# Patient Record
Sex: Male | Born: 1995 | Hispanic: No | Marital: Single | Smoking: Never smoker
Health system: Southern US, Community
[De-identification: ages and names within clinical notes are randomized; demographics above are authoritative.]

---
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
## 2017-05-26 ENCOUNTER — Emergency Department (HOSPITAL_COMMUNITY)
Admission: EM | Admit: 2017-05-26 | Discharge: 2017-05-26 | Disposition: A | Discharge status: Home / Self Care | Payer: No Typology Code available for payment source | Attending: Emergency Medicine | Admitting: Emergency Medicine

## 2017-05-26 ENCOUNTER — Encounter (HOSPITAL_COMMUNITY): Payer: Self-pay | Admitting: Emergency Medicine

## 2017-05-26 ENCOUNTER — Emergency Department (HOSPITAL_COMMUNITY): Payer: No Typology Code available for payment source

## 2017-05-26 DIAGNOSIS — N50819 Testicular pain, unspecified: Secondary | ICD-10-CM | POA: Insufficient documentation

## 2017-05-26 LAB — URINALYSIS, ROUTINE W REFLEX MICROSCOPIC
Bilirubin Urine: NEGATIVE
GLUCOSE, UA: NEGATIVE mg/dL
Hgb urine dipstick: NEGATIVE
KETONES UR: 20 mg/dL — AB
LEUKOCYTES UA: NEGATIVE
NITRITE: NEGATIVE
PROTEIN: NEGATIVE mg/dL
Specific Gravity, Urine: 1.021 (ref 1.005–1.030)
pH: 7 (ref 5.0–8.0)

## 2017-05-26 LAB — RAPID HIV SCREEN (HIV 1/2 AB+AG)
HIV 1/2 ANTIBODIES: NONREACTIVE
HIV-1 P24 ANTIGEN - HIV24: NONREACTIVE

## 2017-05-26 MED ORDER — AZITHROMYCIN 1 G PO PACK: 1.0000 g | PACK | Freq: Once | ORAL | Status: DC

## 2017-05-26 MED ORDER — LIDOCAINE HCL (PF) 1 % IJ SOLN
INTRAMUSCULAR | Status: AC
  Administered 2017-05-26: 5 mL
  Filled 2017-05-26: qty 5

## 2017-05-26 MED ORDER — AZITHROMYCIN 250 MG PO TABS
1000.0000 mg | ORAL_TABLET | Freq: Once | ORAL | Status: AC
  Administered 2017-05-26: 1000 mg via ORAL
  Filled 2017-05-26: qty 4

## 2017-05-26 MED ORDER — CEFTRIAXONE SODIUM 250 MG IJ SOLR
250.0000 mg | Freq: Once | INTRAMUSCULAR | Status: AC
  Administered 2017-05-26: 250 mg via INTRAMUSCULAR
  Filled 2017-05-26: qty 250

## 2017-05-26 NOTE — ED Provider Notes (Signed)
[facility] EMERGENCY DEPARTMENT Provider Note   CSN: [id number] Arrival date & time: 05/26/17  1134     History   Chief Complaint Chief Complaint  Patient presents with  . Testicle Pain    HPI Jason Stevens is a 21 y.o. male.  HPI   Mr. Summer a 21 year old male with no significant past medical history who presents the emergency department for evaluation of bilateral testicular pain.  He states that he was diagnosed with chlamydia 2 years ago and has had intermittent episodes of bilateral testicular pain since that time.  Reports that he was seen in [place] for a similar complaint 6 months ago and told that all of his testing came back normal.  Current episode of pain has been going on for the past 5 days.  States that it came on suddenly and has been constant ever since.  Pain is reported to be 8/10 in severity and described as "heavy."  He has not tried any over-the-counter medications for his symptoms.  States that nothing makes it better.  He also endorses mild generalized abdominal pain, states that he feels like he is hungry as he did not eat yesterday or today.  Denies fever, dysuria, frequency, penile discharge, penile pain, testicular swelling, nausea/vomiting.  He denies recent trauma to the testicles.  Reports that he is not currently sexually active, last reported sexual activity was 2 years ago.  He is specifically asking to be tested for STDs.  History reviewed. No pertinent past medical history.  There are no active problems to display for this patient.   History reviewed. No pertinent surgical history.     Home Medications    Prior to Admission medications   Not on File    Family History No family history on file.  Social History Social History   Tobacco Use  . Smoking status: Never Smoker  . Smokeless tobacco: Never Used  Substance Use Topics  . Alcohol use: No    Frequency: Never  . Drug use: No      Allergies   Patient has no allergy information on record.   Review of Systems Review of Systems  Constitutional: Negative for chills, fatigue and fever.  Eyes: Negative for visual disturbance.  Respiratory: Negative for shortness of breath.   Cardiovascular: Negative for chest pain.  Gastrointestinal: Positive for abdominal pain. Negative for diarrhea, nausea and vomiting.  Genitourinary: Positive for testicular pain. Negative for decreased urine volume, difficulty urinating, discharge, dysuria, frequency, genital sores, penile pain, penile swelling and scrotal swelling.  Musculoskeletal: Negative for gait problem.  Skin: Negative for rash.  Neurological: Negative for headaches.  Psychiatric/Behavioral: Negative for agitation.  All other systems reviewed and are negative.    Physical Exam Updated Vital Signs BP 136/82 (BP Location: Left Arm)   Pulse 78   Temp 98.5 F (36.9 C) (Oral)   Resp 16   Ht 5\' 8"  (1.727 m)   Wt 111.1 kg (245 lb)   SpO2 97%   BMI 37.25 kg/m   Physical Exam  Constitutional: He is oriented to person, place, and time. He appears well-developed and well-nourished. No distress.  HENT:  Head: Normocephalic and atraumatic.  Mouth/Throat: Oropharynx is clear and moist. No oropharyngeal exudate.  Eyes: Conjunctivae are normal. Pupils are equal, round, and reactive to light. Right eye exhibits no discharge. Left eye exhibits no discharge.  Neck: Normal range of motion. Neck supple.  Cardiovascular: Normal rate, regular rhythm and intact distal pulses. Exam reveals no  friction rub.  No murmur heard. Pulmonary/Chest: Effort normal and breath sounds normal. No stridor. No respiratory distress. He has no wheezes. He has no rales.  Abdominal: Soft. Bowel sounds are normal. He exhibits no distension. There is no tenderness. There is no guarding.  No CVA tenderness.  Genitourinary:  Genitourinary Comments: Chaperone present for exam. No discharge from  penis. No signs of lesion or erythema on the penis or testicles. The penis and testicles are nontender. No testicular masses or swelling.   Musculoskeletal: Normal range of motion.  Lymphadenopathy:    He has no cervical adenopathy.  Neurological: He is alert and oriented to person, place, and time. Coordination normal.  Skin: Skin is warm and dry. Capillary refill takes less than 2 seconds. He is not diaphoretic.  Psychiatric: He has a normal mood and affect. His behavior is normal.  Nursing note and vitals reviewed.   ED Treatments / Results  Labs (all labs ordered are listed, but only abnormal results are displayed) Labs Reviewed  URINALYSIS, ROUTINE W REFLEX MICROSCOPIC - Abnormal; Notable for the following components:      Result Value   APPearance CLOUDY (*)    Ketones, ur 20 (*)    All other components within normal limits  RAPID HIV SCREEN (HIV 1/2 AB+AG)  RPR  GC/CHLAMYDIA PROBE AMP ([employer]) NOT AT [facility]    EKG  EKG Interpretation None       Radiology US Scrotum  Result Date: 05/26/2017 CLINICAL DATA:  Initial evaluation for bilateral pelvic pain for 5 days. EXAM: ULTRASOUND OF SCROTUM TECHNIQUE: Complete ultrasound examination of the testicles, epididymis, and other scrotal structures was performed. COMPARISON:  None. FINDINGS: Right testicle Measurements: 4.4 x 2.4 x 2.8 cm. No mass or microlithiasis visualized. Left testicle Measurements: 3.8 x 2.3 x 3.2 cm. No mass or microlithiasis visualized. Right epididymis:  Normal in size and appearance. Left epididymis: Normal in size and appearance. 6 x 7 x 6 mm simple anechoic cyst at the left epididymal head, of doubtful significance. Hydrocele:  None visualized. Varicocele:  None visualized. IMPRESSION: Negative. No evidence for testicular mass or other significant abnormality. Electronically Signed   By: Rise Mu M.D.   On: 05/26/2017 16:01   [place] Scrotom Doppler  Result Date: 05/26/2017 CLINICAL DATA:   Initial evaluation for bilateral pelvic pain for 5 days. EXAM: ULTRASOUND OF SCROTUM TECHNIQUE: Complete ultrasound examination of the testicles, epididymis, and other scrotal structures was performed. COMPARISON:  None. FINDINGS: Right testicle Measurements: 4.4 x 2.4 x 2.8 cm. No mass or microlithiasis visualized. Left testicle Measurements: 3.8 x 2.3 x 3.2 cm. No mass or microlithiasis visualized. Right epididymis:  Normal in size and appearance. Left epididymis: Normal in size and appearance. 6 x 7 x 6 mm simple anechoic cyst at the left epididymal head, of doubtful significance. Hydrocele:  None visualized. Varicocele:  None visualized. IMPRESSION: Negative. No evidence for testicular mass or other significant abnormality. Electronically Signed   By: Rise Mu M.D.   On: 05/26/2017 16:01    Procedures Procedures (including critical care time)  Medications Ordered in ED Medications  cefTRIAXone (ROCEPHIN) injection 250 mg (not administered)  azithromycin (ZITHROMAX) tablet 1,000 mg (not administered)     Initial Impression / Assessment and Plan / ED Course  I have reviewed the triage vital signs and the nursing notes.  Pertinent labs & imaging results that were available during my care of the patient were reviewed by me and considered in my medical decision making (  see chart for details).    Patient presents with 5 days of bilateral testicular pain.  He has had this intermittently in the past.  No swelling, erythema or pain to the testicles on exam.  No swelling or mass palpated.  Ultrasound of scrotum and Doppler do not reveal testicular mass, torsion or abnormality.  UA not concerning for infection.  He does have a small amount of ketones in his urine, suspect that this is related to the fact that he has not eaten in the past day and a half.  Have informed patient to have this rechecked.  Rapid HIV screen nonreactive.  I have informed patient that he has a GC/chlamydia and  syphilis testing that is pending and he will get a phone call if they are positive.  Patient is specifically requesting a "shot and pill" to treat an STD just in case.  Given patient's concern, have given him Rocephin and azithromycin in the ER.  Patient's vital signs stable, he is nontoxic-appearing and afebrile.  Discussed return precautions and patient agrees and voiced understanding.  Have discussed this patient who was also seen by Dr. Ethelda ChickJacubowitz who also agrees with discharge.   Final Clinical Impressions(s) / ED Diagnoses   Final diagnoses:  Testicular pain    New Prescriptions This SmartLink is deprecated. Use AVSMEDLIST instead to display the medication list for a patient.   Kellie ShropshireShrosbree, Emily J, PA-C 05/26/17 1649    Doug SouJacubowitz, Sam, MD 05/26/17 2053

## 2017-05-26 NOTE — ED Notes (Signed)
Patient Alert and oriented X4. Stable and ambulatory. Patient verbalized understanding of the discharge instructions.  Patient belongings were taken by the patient.  

## 2017-05-26 NOTE — ED Triage Notes (Signed)
Pt. Stated, Im having testicular pain started on Wednesday. I've had it before.

## 2017-05-26 NOTE — ED Provider Notes (Addendum)
Bilateral testicular pain, posteriorly for 5 days.  Pain is not made better or worse by anything.  Pain is mild at present.  No urinary symptoms.  Presently hungry.  Denies discharge from penis.  No other associated symptoms.  He does report that he had similar symptoms in the past and was treated for chlamydia which improved.  On exam no distress abdomen soft nontender genitalia normal male.  Scrotum is normal.  Testes in normal lie.  No tenderness or swelling.  Penis uncircumcised, normal.  Declines pain medicine   Doug SouJacubowitz, Carry Weesner, MD 05/26/17 1327    Doug SouJacubowitz, Tonnie Friedel, MD 05/26/17 1327

## 2017-05-26 NOTE — ED Notes (Signed)
Pt ambulates to bathroom wihtou difficulty.

## 2017-05-26 NOTE — Discharge Instructions (Signed)
You will get a call in the next 3 days if your chlamydia and gonorrhea testing is positive.  You also have syphilis blood work which is pending.  Your HIV test was negative.  Your urine is not infected.  The ultrasound of your testicles did not show mass, torsion or acute abnormality.  You were treated for chlamydia and gonorrhea in the ER today.  Please return to the emergency department if you experience any new or worsening symptoms.

## 2017-05-26 NOTE — ED Notes (Signed)
Pt c/o testicle pain x 5 days. Denies swelling or drainage . Pain only. Ambulates easilty to room and placedc in gown.

## 2017-05-27 LAB — GC/CHLAMYDIA PROBE AMP (~~LOC~~) NOT AT ARMC
Chlamydia: NEGATIVE
Neisseria Gonorrhea: NEGATIVE

## 2017-05-27 LAB — RPR: RPR Ser Ql: NONREACTIVE

## 2017-07-14 ENCOUNTER — Other Ambulatory Visit: Payer: Self-pay

## 2017-07-14 ENCOUNTER — Emergency Department (HOSPITAL_COMMUNITY)
Admission: EM | Admit: 2017-07-14 | Discharge: 2017-07-14 | Disposition: A | Discharge status: Home / Self Care | Payer: No Typology Code available for payment source | Attending: Emergency Medicine | Admitting: Emergency Medicine

## 2017-07-14 ENCOUNTER — Encounter (HOSPITAL_COMMUNITY): Payer: Self-pay | Admitting: Emergency Medicine

## 2017-07-14 ENCOUNTER — Emergency Department (HOSPITAL_COMMUNITY): Payer: No Typology Code available for payment source

## 2017-07-14 DIAGNOSIS — M7918 Myalgia, other site: Secondary | ICD-10-CM | POA: Insufficient documentation

## 2017-07-14 DIAGNOSIS — R5383 Other fatigue: Secondary | ICD-10-CM | POA: Diagnosis not present

## 2017-07-14 DIAGNOSIS — M6281 Muscle weakness (generalized): Secondary | ICD-10-CM | POA: Diagnosis not present

## 2017-07-14 DIAGNOSIS — E860 Dehydration: Secondary | ICD-10-CM

## 2017-07-14 DIAGNOSIS — R1013 Epigastric pain: Secondary | ICD-10-CM | POA: Diagnosis not present

## 2017-07-14 DIAGNOSIS — Z79899 Other long term (current) drug therapy: Secondary | ICD-10-CM | POA: Insufficient documentation

## 2017-07-14 DIAGNOSIS — R531 Weakness: Secondary | ICD-10-CM

## 2017-07-14 LAB — COMPREHENSIVE METABOLIC PANEL
ALT: 22 U/L (ref 17–63)
ANION GAP: 9 (ref 5–15)
AST: 15 U/L (ref 15–41)
Albumin: 4.4 g/dL (ref 3.5–5.0)
Alkaline Phosphatase: 72 U/L (ref 38–126)
BUN: 12 mg/dL (ref 6–20)
CALCIUM: 9.3 mg/dL (ref 8.9–10.3)
CHLORIDE: 104 mmol/L (ref 101–111)
CO2: 23 mmol/L (ref 22–32)
Creatinine, Ser: 1.06 mg/dL (ref 0.61–1.24)
GFR calc Af Amer: >60 mL/min (ref >60)
GFR calc non Af Amer: >60 mL/min (ref >60)
GLUCOSE: 112 mg/dL — AB (ref 65–99)
POTASSIUM: 3.4 mmol/L — AB (ref 3.5–5.1)
Sodium: 136 mmol/L (ref 135–145)
Total Bilirubin: 1.1 mg/dL (ref 0.3–1.2)
Total Protein: 7.6 g/dL (ref 6.5–8.1)

## 2017-07-14 LAB — CBC
HEMATOCRIT: 45.9 % (ref 39.0–52.0)
HEMOGLOBIN: 16.3 g/dL (ref 13.0–17.0)
MCH: 30 pg (ref 26.0–34.0)
MCHC: 35.5 g/dL (ref 30.0–36.0)
MCV: 84.4 fL (ref 78.0–100.0)
Platelets: 315 10*3/uL (ref 150–400)
RBC: 5.44 MIL/uL (ref 4.22–5.81)
RDW: 12.9 % (ref 11.5–15.5)
WBC: 9.7 10*3/uL (ref 4.0–10.5)

## 2017-07-14 LAB — URINALYSIS, ROUTINE W REFLEX MICROSCOPIC
BACTERIA UA: NONE SEEN
Glucose, UA: NEGATIVE mg/dL
Hgb urine dipstick: NEGATIVE
Ketones, ur: NEGATIVE mg/dL
Leukocytes, UA: NEGATIVE
Nitrite: NEGATIVE
PROTEIN: 30 mg/dL — AB
Specific Gravity, Urine: 1.032 — ABNORMAL HIGH (ref 1.005–1.030)
Squamous Epithelial / LPF: NONE SEEN
pH: 6 (ref 5.0–8.0)

## 2017-07-14 LAB — RAPID HIV SCREEN (HIV 1/2 AB+AG)
HIV 1/2 ANTIBODIES: NONREACTIVE
HIV-1 P24 Antigen - HIV24: NONREACTIVE

## 2017-07-14 LAB — MONONUCLEOSIS SCREEN: MONO SCREEN: NEGATIVE

## 2017-07-14 LAB — LIPASE, BLOOD: LIPASE: 25 U/L (ref 11–51)

## 2017-07-14 MED ORDER — GI COCKTAIL ~~LOC~~
30.0000 mL | Freq: Once | ORAL | Status: AC
  Administered 2017-07-14: 30 mL via ORAL
  Filled 2017-07-14: qty 30

## 2017-07-14 MED ORDER — POTASSIUM CHLORIDE CRYS ER 20 MEQ PO TBCR
40.0000 meq | EXTENDED_RELEASE_TABLET | Freq: Once | ORAL | Status: AC
  Administered 2017-07-14: 40 meq via ORAL
  Filled 2017-07-14: qty 2

## 2017-07-14 MED ORDER — OMEPRAZOLE 20 MG PO CPDR: 20.0000 mg | DELAYED_RELEASE_CAPSULE | Freq: Every day | ORAL | 0 refills | Status: AC

## 2017-07-14 MED ORDER — ONDANSETRON 4 MG PO TBDP: 4.0000 mg | ORAL_TABLET | Freq: Three times a day (TID) | ORAL | 0 refills | Status: AC | PRN

## 2017-07-14 NOTE — ED Notes (Signed)
Patient transported to X-ray 

## 2017-07-14 NOTE — ED Triage Notes (Signed)
Pt to ED from home c/o generalized weakness, chills x 2 weeks. Also endorses generalized abd pain and N/V. Denies diarrhea, denies cough. Pt adds that he's been having bilateral testicular pain which he has been seen for and told nothing was wrong, but it continues.

## 2017-07-14 NOTE — ED Provider Notes (Signed)
[facility] Provider Note   CSN: [id number] Arrival date & time: 07/14/17  1445     History   Chief Complaint Chief Complaint  Patient presents with  . Weakness    generalized    HPI Jason Stevens is a 21 y.o. male.  HPI   21 year old male with no significant past medical history here with generalized weakness.  The patient states that over the last month, he has had generalized fatigue.  He states his symptoms started with an illness in which he felt general body aches, chills, and sore throat approximately 1 month ago.  He was seen in the ED and had some testicular pain which was worked up and unremarkable.  Since then, he has had intermittent body aches, chills, and fatigue.  He states that he just feels "drained."  He has not had much of an appetite.  Of note, he is also been smoking increased marijuana over this time.  Denies any depressive thoughts, but does have some anhedonia.  No SI or HI.  No auditory or visual hallucinations.  He has some intermittent associated indigestion and sensation of burning in his abdomen, this began after his illness and has persisted.  It seems to worsen with eating so he has not been eating and drinking as much.  Denies any recent medication change.  Denies any other drug use.  History reviewed. No pertinent past medical history.  There are no active problems to display for this patient.   History reviewed. No pertinent surgical history.     Home Medications    Prior to Admission medications   Medication Sig Start Date End Date Taking? Authorizing Provider  DM-Doxylamine-Acetaminophen (NYQUIL COLD & FLU PO) Take 30 mLs by mouth at bedtime as needed (sleep).   Yes [provider]  Pseudoephedrine-APAP-DM (DAYQUIL MULTI-SYMPTOM COLD/FLU PO) Take 30 mLs by mouth daily as needed (pain).   Yes [provider]  omeprazole (PRILOSEC) 20 MG capsule Take 1 capsule (20 mg total) by  mouth daily for 14 days. 07/14/17 07/28/17  Shaune PollackIsaacs, Levia Waltermire, MD  ondansetron (ZOFRAN ODT) 4 MG disintegrating tablet Take 1 tablet (4 mg total) by mouth every 8 (eight) hours as needed for nausea or vomiting. 07/14/17   Shaune PollackIsaacs, Donnamaria Shands, MD    Family History No family history on file.  Social History Social History   Tobacco Use  . Smoking status: Never Smoker  . Smokeless tobacco: Never Used  Substance Use Topics  . Alcohol use: No    Frequency: Never  . Drug use: Yes    Types: Marijuana    Comment: every now and then     Allergies   Patient has no known allergies.   Review of Systems Review of Systems  Constitutional: Positive for fatigue.  Gastrointestinal: Positive for abdominal pain and nausea.  Neurological: Positive for weakness.  All other systems reviewed and are negative.    Physical Exam Updated Vital Signs BP 133/88   Pulse 93   Temp 97.7 F (36.5 C) (Oral)   Resp 16   Ht 5\' 10"  (1.778 m)   SpO2 95%   BMI 35.15 kg/m   Physical Exam  Constitutional: He is oriented to person, place, and time. He appears well-developed and well-nourished. No distress.  HENT:  Head: Normocephalic and atraumatic.  Mouth/Throat: Oropharynx is clear and moist.  Eyes: Conjunctivae are normal.  Neck: Neck supple.  Mild asymmetric soft, nontender posterior cervical lymphadenopathy  Cardiovascular: Normal rate, regular rhythm and  normal heart sounds. Exam reveals no friction rub.  No murmur heard. Pulmonary/Chest: Effort normal and breath sounds normal. No respiratory distress. He has no wheezes. He has no rales.  Abdominal: Soft. Bowel sounds are normal. He exhibits no distension. There is tenderness (Mild, epigastric). There is no rebound and no guarding.  Musculoskeletal: He exhibits no edema.  Neurological: He is alert and oriented to person, place, and time. He exhibits normal muscle tone.  Skin: Skin is warm. Capillary refill takes less than 2 seconds.  Psychiatric:  He has a normal mood and affect.  Nursing note and vitals reviewed.    ED Treatments / Results  Labs (all labs ordered are listed, but only abnormal results are displayed) Labs Reviewed  COMPREHENSIVE METABOLIC PANEL - Abnormal; Notable for the following components:      Result Value   Potassium 3.4 (*)    Glucose, Bld 112 (*)    All other components within normal limits  URINALYSIS, ROUTINE W REFLEX MICROSCOPIC - Abnormal; Notable for the following components:   Specific Gravity, Urine 1.032 (*)    Bilirubin Urine MODERATE (*)    Protein, ur 30 (*)    All other components within normal limits  LIPASE, BLOOD  CBC  MONONUCLEOSIS SCREEN  RAPID HIV SCREEN (HIV 1/2 AB+AG)    EKG  EKG Interpretation None       Radiology Dg Chest 2 View  Result Date: 07/14/2017 CLINICAL DATA:  Chest pain and shortness of breath. Fatigue and abdominal pain. Weakness for 2 weeks. EXAM: CHEST  2 VIEW COMPARISON:  None. FINDINGS: The heart size and mediastinal contours are within normal limits. Both lungs are clear. The visualized skeletal structures are unremarkable. IMPRESSION: No active cardiopulmonary disease. Electronically Signed   By: Gaylyn RongWalter  Liebkemann M.D.   On: 07/14/2017 17:11    Procedures Procedures (including critical care time)  Medications Ordered in ED Medications  potassium chloride SA (K-DUR,KLOR-CON) CR tablet 40 mEq (40 mEq Oral Given 07/14/17 1634)  gi cocktail (Maalox,Lidocaine,Donnatal) (30 mLs Oral Given 07/14/17 1634)     Initial Impression / Assessment and Plan / ED Course  I have reviewed the triage vital signs and the nursing notes.  Pertinent labs & imaging results that were available during my care of the patient were reviewed by me and considered in my medical decision making (see chart for details).     21 year old male here with generalized weakness.  Patient has multiple complaints which I suspect are likely multifactorial.  He does report intermittent  indigestion and burning epigastric pain which he feels is contributing to his symptoms.  He has mild epigastric tenderness but no signs of rebound or guarding.  His lab work is unremarkable.  Symptoms resolved with GI cocktail.  No other abdominal pain and do not feel imaging is indicated.  Will treat him for indigestion.  Otherwise, all of his symptoms seem to start with a viral type illness in which she had sore throat.  He has some mild posterior cervical lymphadenopathy.  White count is normal and I do not suspect lymphoma or leukemia at this time, but patient very well may have had a mononucleosis with subsequent post viral fatigue.  Discussed this with the patient in detail.  He is in understanding.  Rapid HIV negative.  He has no other apparent acute emergent pathology.  He just underwent a workup for his reported testicular pain a month ago, and has not had any recurrence or worsening of this pain and I  do not suspect he has any kind of ongoing STD.  Moreover, the patient is also smoking marijuana regularly and this could be contributing to his decreased energy and mood.  Regardless, I do not feel there is an acute emergent pathology for his symptoms.  His vital signs are unremarkable and lab work is unremarkable.  Her abdomen remains soft.  He feels markedly improved after GI cocktail and is requesting to go home.  I feel this is reasonable.  I will have him follow-up with the PCP for his multiple complaints, encouraged smoking cessation, and discharged home.  Final Clinical Impressions(s) / ED Diagnoses   Final diagnoses:  Generalized weakness  Dehydration    ED Discharge Orders        Ordered    omeprazole (PRILOSEC) 20 MG capsule  Daily     07/14/17 1829    ondansetron (ZOFRAN ODT) 4 MG disintegrating tablet  Every 8 hours PRN     07/14/17 Thurston Pounds, MD 07/14/17 [id number]

## 2017-07-14 NOTE — ED Notes (Signed)
Declined W/C at D/C and was escorted to lobby by RN. 

## 2017-07-14 NOTE — ED Notes (Signed)
Back from xray

## 2017-07-14 NOTE — Discharge Instructions (Signed)
Your symptoms are likely due to your recent viral illness, possibly mono, with some ongoing indigestion.  For your indigestion, take the medications as prescribed.  Because you may have had mono, you should not engage in ANY contact sports for at least the next 1 month, or until cleared by your doctor. Your spleen may be enlarged and could rupture if you are struck in the abdomen. Avoid any high risk activities in which you could be hit.  For your weakness, your labs did show you were dehydrated. Drink at least 6-8 glasses of water daily. Try to eat a well-balanced diet.  Smoking significantly impairs your body's ability to recover from illness. Try to decrease the amount you are smoking, or stop.

## 2017-07-23 ENCOUNTER — Other Ambulatory Visit: Payer: Self-pay

## 2017-07-23 ENCOUNTER — Emergency Department (HOSPITAL_COMMUNITY)
Admission: EM | Admit: 2017-07-23 | Discharge: 2017-07-23 | Discharge status: Against Medical Advice | Payer: No Typology Code available for payment source | Attending: Emergency Medicine | Admitting: Emergency Medicine

## 2017-07-23 DIAGNOSIS — N50812 Left testicular pain: Secondary | ICD-10-CM | POA: Diagnosis not present

## 2017-07-23 DIAGNOSIS — Z5321 Procedure and treatment not carried out due to patient leaving prior to being seen by health care provider: Secondary | ICD-10-CM | POA: Diagnosis not present

## 2017-07-23 NOTE — ED Triage Notes (Signed)
Pt reports left testicle pain x 4 weeks.  Pt denies any swelling.  Pt denies any urination problems.  Movement makes the pain worse.  Pt reports pain is an 8 out of 10 in triage.  Pt states he's been seen for this problem at [place] when the problem first began. Pt a/o x 4 and ambulatory.

## 2017-07-23 NOTE — ED Notes (Addendum)
Pt called from the lobby with no response x2 

## 2017-07-23 NOTE — ED Notes (Signed)
Pt called for room no answer

## 2017-12-02 ENCOUNTER — Emergency Department (HOSPITAL_COMMUNITY): Payer: Self-pay

## 2017-12-02 ENCOUNTER — Other Ambulatory Visit: Payer: Self-pay

## 2017-12-02 ENCOUNTER — Emergency Department (HOSPITAL_COMMUNITY)
Admission: EM | Admit: 2017-12-02 | Discharge: 2017-12-02 | Disposition: A | Discharge status: Home / Self Care | Payer: Self-pay | Attending: Emergency Medicine | Admitting: Emergency Medicine

## 2017-12-02 ENCOUNTER — Encounter (HOSPITAL_COMMUNITY): Payer: Self-pay

## 2017-12-02 DIAGNOSIS — Z79899 Other long term (current) drug therapy: Secondary | ICD-10-CM | POA: Insufficient documentation

## 2017-12-02 DIAGNOSIS — R109 Unspecified abdominal pain: Secondary | ICD-10-CM

## 2017-12-02 DIAGNOSIS — R1033 Periumbilical pain: Secondary | ICD-10-CM | POA: Insufficient documentation

## 2017-12-02 LAB — URINALYSIS, ROUTINE W REFLEX MICROSCOPIC
BILIRUBIN URINE: NEGATIVE
GLUCOSE, UA: NEGATIVE mg/dL
HGB URINE DIPSTICK: NEGATIVE
Ketones, ur: NEGATIVE mg/dL
Leukocytes, UA: NEGATIVE
Nitrite: NEGATIVE
PROTEIN: NEGATIVE mg/dL
SPECIFIC GRAVITY, URINE: 1.02 (ref 1.005–1.030)
pH: 7 (ref 5.0–8.0)

## 2017-12-02 LAB — CBC
HCT: 48.4 % (ref 39.0–52.0)
Hemoglobin: 16.7 g/dL (ref 13.0–17.0)
MCH: 29.7 pg (ref 26.0–34.0)
MCHC: 34.5 g/dL (ref 30.0–36.0)
MCV: 86 fL (ref 78.0–100.0)
PLATELETS: 320 10*3/uL (ref 150–400)
RBC: 5.63 MIL/uL (ref 4.22–5.81)
RDW: 13.7 % (ref 11.5–15.5)
WBC: 7.6 10*3/uL (ref 4.0–10.5)

## 2017-12-02 LAB — COMPREHENSIVE METABOLIC PANEL
ALBUMIN: 4.4 g/dL (ref 3.5–5.0)
ALT: 27 U/L (ref 17–63)
AST: 18 U/L (ref 15–41)
Alkaline Phosphatase: 70 U/L (ref 38–126)
Anion gap: 6 (ref 5–15)
BUN: 11 mg/dL (ref 6–20)
CHLORIDE: 106 mmol/L (ref 101–111)
CO2: 27 mmol/L (ref 22–32)
CREATININE: 1.02 mg/dL (ref 0.61–1.24)
Calcium: 9.4 mg/dL (ref 8.9–10.3)
GFR calc non Af Amer: >60 mL/min (ref >60)
Glucose, Bld: 99 mg/dL (ref 65–99)
Potassium: 4.3 mmol/L (ref 3.5–5.1)
SODIUM: 139 mmol/L (ref 135–145)
Total Bilirubin: 0.5 mg/dL (ref 0.3–1.2)
Total Protein: 7.5 g/dL (ref 6.5–8.1)

## 2017-12-02 LAB — LIPASE, BLOOD: LIPASE: 26 U/L (ref 11–51)

## 2017-12-02 MED ORDER — ONDANSETRON HCL 4 MG PO TABS: 4.0000 mg | ORAL_TABLET | Freq: Four times a day (QID) | ORAL | 0 refills | Status: AC

## 2017-12-02 MED ORDER — IOHEXOL 300 MG/ML  SOLN
100.0000 mL | Freq: Once | INTRAMUSCULAR | Status: AC | PRN
  Administered 2017-12-02: 100 mL via INTRAVENOUS

## 2017-12-02 MED ORDER — ONDANSETRON 4 MG PO TBDP
4.0000 mg | ORAL_TABLET | Freq: Once | ORAL | Status: AC
  Administered 2017-12-02: 4 mg via ORAL
  Filled 2017-12-02: qty 1

## 2017-12-02 NOTE — Discharge Instructions (Addendum)
Please read attached information. If you experience any new or worsening signs or symptoms please return to the emergency room for evaluation. Please follow-up with your primary care provider or specialist as discussed. Please use medication prescribed only as directed and discontinue taking if you have any concerning signs or symptoms.   °

## 2017-12-02 NOTE — ED Triage Notes (Signed)
Pt states he woke up this morning with vomiting and some generalized abdominal pain. Pt states emesis X2, denies diarrhea.

## 2017-12-02 NOTE — ED Provider Notes (Signed)
[facility] EMERGENCY DEPARTMENT Provider Note   CSN: [id number] Arrival date & time: 12/02/17  0907     History   Chief Complaint Chief Complaint  Patient presents with  . Emesis    HPI Jason Stevens is a 22 y.o. male.  HPI   22 year old male presents with complaints of abdominal pain.  Patient notes Friday developing periumbilical abdominal pain.  He notes that over the last several days it has worsened.  He denies any radiation of pain, he reports vomiting today noting he was unable to eat breakfast, was able to tolerate liquids.  He denies any changes in the color or clarity or characteristics of his bowel movement.  Patient notes he has had symptoms like this previously.   History reviewed. No pertinent past medical history.  There are no active problems to display for this patient.   History reviewed. No pertinent surgical history.      Home Medications    Prior to Admission medications   Medication Sig Start Date End Date Taking? Authorizing Provider  DM-Doxylamine-Acetaminophen (NYQUIL COLD & FLU PO) Take 30 mLs by mouth at bedtime as needed (sleep).    [provider]  omeprazole (PRILOSEC) 20 MG capsule Take 1 capsule (20 mg total) by mouth daily for 14 days. 07/14/17 07/28/17  Shaune Pollack, MD  ondansetron (ZOFRAN ODT) 4 MG disintegrating tablet Take 1 tablet (4 mg total) by mouth every 8 (eight) hours as needed for nausea or vomiting. 07/14/17   Shaune Pollack, MD  ondansetron (ZOFRAN) 4 MG tablet Take 1 tablet (4 mg total) by mouth every 6 (six) hours. 12/02/17   Kamie Korber, Tinnie Gens, PA-C  Pseudoephedrine-APAP-DM (DAYQUIL MULTI-SYMPTOM COLD/FLU PO) Take 30 mLs by mouth daily as needed (pain).    [provider]    Family History History reviewed. No pertinent family history.  Social History Social History   Tobacco Use  . Smoking status: Never Smoker  . Smokeless tobacco: Never Used  Substance Use Topics  .  Alcohol use: No    Frequency: Never  . Drug use: Yes    Types: Marijuana    Comment: every now and then     Allergies   Patient has no known allergies.   Review of Systems Review of Systems  All other systems reviewed and are negative.  Physical Exam Updated Vital Signs BP 139/79 (BP Location: Left Arm)   Pulse (!) 53   Temp 98.6 F (37 C) (Oral)   Resp 16   SpO2 99%   Physical Exam  Constitutional: He is oriented to person, place, and time. He appears well-developed and well-nourished.  HENT:  Head: Normocephalic and atraumatic.  Eyes: Pupils are equal, round, and reactive to light. Conjunctivae are normal. Right eye exhibits no discharge. Left eye exhibits no discharge. No scleral icterus.  Neck: Normal range of motion. No JVD present. No tracheal deviation present.  Pulmonary/Chest: Effort normal. No stridor.  Abdominal:  Tenderness palpation of the umbilical region, no masses rebound or guarding, remainder of abdominal exam nontender  Neurological: He is alert and oriented to person, place, and time. Coordination normal.  Psychiatric: He has a normal mood and affect. His behavior is normal. Judgment and thought content normal.  Nursing note and vitals reviewed.    ED Treatments / Results  Labs (all labs ordered are listed, but only abnormal results are displayed) Labs Reviewed  LIPASE, BLOOD  COMPREHENSIVE METABOLIC PANEL  CBC  URINALYSIS, ROUTINE W REFLEX MICROSCOPIC  EKG None  Radiology Ct Abdomen Pelvis W Contrast  Result Date: 12/02/2017 CLINICAL DATA:  Umbilical region cramping for 3 days.  Diarrhea. EXAM: CT ABDOMEN AND PELVIS WITH CONTRAST TECHNIQUE: Multidetector CT imaging of the abdomen and pelvis was performed using the standard protocol following bolus administration of intravenous contrast. CONTRAST:  OMNIPAQUE IOHEXOL 300 MG/ML  SOLN COMPARISON:  None. FINDINGS: Lower chest: Lung bases are clear. Hepatobiliary: No focal liver lesions  are appreciable. Gallbladder wall is not appreciably thickened. There is no biliary duct dilatation. Pancreas: No pancreatic mass or inflammatory focus. Spleen: No splenic lesions are evident. Adrenals/Urinary Tract: Adrenals bilaterally appear normal. Kidneys bilaterally show no evident mass or hydronephrosis on either side. There is no appreciable renal or ureteral calculus on either side. Urinary bladder is midline with wall thickness within normal limits. Stomach/Bowel: There is no appreciable bowel wall or mesenteric thickening. No evident bowel obstruction. No free air or portal venous air. Vascular/Lymphatic: No abdominal aortic aneurysm. No vascular lesions are evident. No adenopathy is appreciable in the abdomen or pelvis. Reproductive: Prostate and seminal vesicles are normal in size and contour. No evident pelvic mass. Other: Appendix appears normal. No ascites or abscess is evident in the abdomen or pelvis. Musculoskeletal: There are no blastic or lytic bone lesions. There is no intramuscular or abdominal wall lesion. IMPRESSION: 1. A cause for patient's symptoms has not been established with this study. 2. No bowel wall thickening or bowel obstruction. No abscess. Appendix appears normal. 3.  No renal or ureteral calculus.  No hydronephrosis. Electronically Signed   By: Bretta Bang III M.D.   On: 12/02/2017 15:51    Procedures Procedures (including critical care time)  Medications Ordered in ED Medications  ondansetron (ZOFRAN-ODT) disintegrating tablet 4 mg (4 mg Oral Given 12/02/17 1221)  iohexol (OMNIPAQUE) 300 MG/ML solution 100 mL (100 mLs Intravenous Contrast Given 12/02/17 1529)     Initial Impression / Assessment and Plan / ED Course  I have reviewed the triage vital signs and the nursing notes.  Pertinent labs & imaging results that were available during my care of the patient were reviewed by me and considered in my medical decision making (see chart for details).       Final Clinical Impressions(s) / ED Diagnoses   Final diagnoses:  Abdominal pain, unspecified abdominal location   Labs: Urinalysis, lipase, CMP  Imaging: CT abdomen pelvis with contrast  Consults:  Therapeutics: Zofran  Discharge Meds: Zofran  Assessment/Plan: 22 year old male presents today with nausea vomiting abdominal pain.  Uncertain etiology at this time, question gastroenteritis, no signs of acute findings on laboratory analysis CT scan or vital signs.  Patient is tolerating p.o., stable for outpatient follow-up.  Patient is given strict return precautions, he verbalized understanding and agreement to today's plan had no further questions or concerns    ED Discharge Orders        Ordered    ondansetron (ZOFRAN) 4 MG tablet  Every 6 hours     12/02/17 1610       Eyvonne Mechanic, PA-C 12/02/17 1611    Wynetta Fines, MD 12/04/17 [phone]

## 2019-04-02 ENCOUNTER — Emergency Department (HOSPITAL_COMMUNITY)
Admission: EM | Admit: 2019-04-02 | Discharge: 2019-04-03 | Disposition: A | Discharge status: Home / Self Care | Payer: No Typology Code available for payment source | Attending: Emergency Medicine | Admitting: Emergency Medicine

## 2019-04-02 ENCOUNTER — Other Ambulatory Visit: Payer: Self-pay

## 2019-04-02 DIAGNOSIS — R1084 Generalized abdominal pain: Secondary | ICD-10-CM | POA: Insufficient documentation

## 2019-04-02 DIAGNOSIS — Z5321 Procedure and treatment not carried out due to patient leaving prior to being seen by health care provider: Secondary | ICD-10-CM | POA: Insufficient documentation

## 2019-04-02 LAB — COMPREHENSIVE METABOLIC PANEL
ALT: 31 U/L (ref 0–44)
AST: 17 U/L (ref 15–41)
Albumin: 4.4 g/dL (ref 3.5–5.0)
Alkaline Phosphatase: 72 U/L (ref 38–126)
Anion gap: 11 (ref 5–15)
BUN: 7 mg/dL (ref 6–20)
CO2: 22 mmol/L (ref 22–32)
Calcium: 9.3 mg/dL (ref 8.9–10.3)
Chloride: 103 mmol/L (ref 98–111)
Creatinine, Ser: 1.02 mg/dL (ref 0.61–1.24)
GFR calc Af Amer: >60 mL/min (ref >60)
GFR calc non Af Amer: >60 mL/min (ref >60)
Glucose, Bld: 87 mg/dL (ref 70–99)
Potassium: 3.6 mmol/L (ref 3.5–5.1)
Sodium: 136 mmol/L (ref 135–145)
Total Bilirubin: 1 mg/dL (ref 0.3–1.2)
Total Protein: 7.6 g/dL (ref 6.5–8.1)

## 2019-04-02 LAB — CBC
HCT: 51.2 % (ref 39.0–52.0)
Hemoglobin: 17.2 g/dL — ABNORMAL HIGH (ref 13.0–17.0)
MCH: 28.7 pg (ref 26.0–34.0)
MCHC: 33.6 g/dL (ref 30.0–36.0)
MCV: 85.5 fL (ref 80.0–100.0)
Platelets: 333 10*3/uL (ref 150–400)
RBC: 5.99 MIL/uL — ABNORMAL HIGH (ref 4.22–5.81)
RDW: 12.6 % (ref 11.5–15.5)
WBC: 9.4 10*3/uL (ref 4.0–10.5)
nRBC: 0 % (ref 0.0–0.2)

## 2019-04-02 LAB — URINALYSIS, ROUTINE W REFLEX MICROSCOPIC
Bilirubin Urine: NEGATIVE
Glucose, UA: NEGATIVE mg/dL
Hgb urine dipstick: NEGATIVE
Ketones, ur: NEGATIVE mg/dL
Leukocytes,Ua: NEGATIVE
Nitrite: NEGATIVE
Protein, ur: NEGATIVE mg/dL
Specific Gravity, Urine: 1.008 (ref 1.005–1.030)
pH: 5 (ref 5.0–8.0)

## 2019-04-02 LAB — LIPASE, BLOOD: Lipase: 24 U/L (ref 11–51)

## 2019-04-02 MED ORDER — SODIUM CHLORIDE 0.9% FLUSH: 3.0000 mL | Freq: Once | INTRAVENOUS | Status: DC

## 2019-04-02 NOTE — ED Triage Notes (Signed)
Patient reports generalized abdominal pain x 1 week, sometimes worse with walking. Denies N/V/D, urinary symptoms, fevers/chills.

## 2019-04-04 ENCOUNTER — Emergency Department (HOSPITAL_BASED_OUTPATIENT_CLINIC_OR_DEPARTMENT_OTHER): Payer: Self-pay

## 2019-04-04 ENCOUNTER — Other Ambulatory Visit: Payer: Self-pay

## 2019-04-04 ENCOUNTER — Emergency Department (HOSPITAL_BASED_OUTPATIENT_CLINIC_OR_DEPARTMENT_OTHER)
Admission: EM | Admit: 2019-04-04 | Discharge: 2019-04-04 | Disposition: A | Discharge status: Home / Self Care | Payer: Self-pay | Attending: Medical | Admitting: Medical

## 2019-04-04 ENCOUNTER — Encounter (HOSPITAL_BASED_OUTPATIENT_CLINIC_OR_DEPARTMENT_OTHER): Payer: Self-pay | Admitting: Emergency Medicine

## 2019-04-04 DIAGNOSIS — M546 Pain in thoracic spine: Secondary | ICD-10-CM | POA: Insufficient documentation

## 2019-04-04 DIAGNOSIS — R1031 Right lower quadrant pain: Secondary | ICD-10-CM | POA: Insufficient documentation

## 2019-04-04 LAB — URINALYSIS, ROUTINE W REFLEX MICROSCOPIC
Bilirubin Urine: NEGATIVE
Glucose, UA: NEGATIVE mg/dL
Hgb urine dipstick: NEGATIVE
Ketones, ur: 40 mg/dL — AB
Leukocytes,Ua: NEGATIVE
Nitrite: NEGATIVE
Protein, ur: NEGATIVE mg/dL
Specific Gravity, Urine: 1.02 (ref 1.005–1.030)
pH: 6 (ref 5.0–8.0)

## 2019-04-04 LAB — CBC WITH DIFFERENTIAL/PLATELET
Abs Immature Granulocytes: 0.02 10*3/uL (ref 0.00–0.07)
Basophils Absolute: 0.1 10*3/uL (ref 0.0–0.1)
Basophils Relative: 1 %
Eosinophils Absolute: 0.1 10*3/uL (ref 0.0–0.5)
Eosinophils Relative: 1 %
HCT: 52.8 % — ABNORMAL HIGH (ref 39.0–52.0)
Hemoglobin: 17.6 g/dL — ABNORMAL HIGH (ref 13.0–17.0)
Immature Granulocytes: 0 %
Lymphocytes Relative: 24 %
Lymphs Abs: 2.5 10*3/uL (ref 0.7–4.0)
MCH: 28 pg (ref 26.0–34.0)
MCHC: 33.3 g/dL (ref 30.0–36.0)
MCV: 84.1 fL (ref 80.0–100.0)
Monocytes Absolute: 0.7 10*3/uL (ref 0.1–1.0)
Monocytes Relative: 7 %
Neutro Abs: 7.1 10*3/uL (ref 1.7–7.7)
Neutrophils Relative %: 67 %
Platelets: 344 10*3/uL (ref 150–400)
RBC: 6.28 MIL/uL — ABNORMAL HIGH (ref 4.22–5.81)
RDW: 12.5 % (ref 11.5–15.5)
WBC: 10.5 10*3/uL (ref 4.0–10.5)
nRBC: 0 % (ref 0.0–0.2)

## 2019-04-04 LAB — COMPREHENSIVE METABOLIC PANEL
ALT: 28 U/L (ref 0–44)
AST: 16 U/L (ref 15–41)
Albumin: 4.9 g/dL (ref 3.5–5.0)
Alkaline Phosphatase: 81 U/L (ref 38–126)
Anion gap: 14 (ref 5–15)
BUN: 14 mg/dL (ref 6–20)
CO2: 23 mmol/L (ref 22–32)
Calcium: 9.3 mg/dL (ref 8.9–10.3)
Chloride: 99 mmol/L (ref 98–111)
Creatinine, Ser: 0.9 mg/dL (ref 0.61–1.24)
GFR calc Af Amer: >60 mL/min (ref >60)
GFR calc non Af Amer: >60 mL/min (ref >60)
Glucose, Bld: 89 mg/dL (ref 70–99)
Potassium: 3.6 mmol/L (ref 3.5–5.1)
Sodium: 136 mmol/L (ref 135–145)
Total Bilirubin: 1 mg/dL (ref 0.3–1.2)
Total Protein: 8.3 g/dL — ABNORMAL HIGH (ref 6.5–8.1)

## 2019-04-04 LAB — LIPASE, BLOOD: Lipase: 27 U/L (ref 11–51)

## 2019-04-04 MED ORDER — IOHEXOL 300 MG/ML  SOLN
100.0000 mL | Freq: Once | INTRAMUSCULAR | Status: AC | PRN
  Administered 2019-04-04: 17:00:00 100 mL via INTRAVENOUS

## 2019-04-04 MED ORDER — SODIUM CHLORIDE 0.9 % IV BOLUS
1000.0000 mL | Freq: Once | INTRAVENOUS | Status: AC
  Administered 2019-04-04: 1000 mL via INTRAVENOUS

## 2019-04-04 NOTE — ED Notes (Signed)
ED Provider at bedside. 

## 2019-04-04 NOTE — ED Provider Notes (Signed)
[facility] EMERGENCY DEPARTMENT Provider Note   CSN: [id number] Arrival date & time: 04/04/19  1207     History   Chief Complaint Chief Complaint  Patient presents with  . Back Pain    HPI Shanne Hams is a 23 y.o. male who presents with low back pain, primarily on the left side.  He denies any injury or history of similar.  He reports that the pain is worse with walking, better when he lays down.  He reports generalized abdominal pain.  He reports urinary hesitancy but denies incontinence or true retention.   He reports that the back pain started approximately 5 days ago on the left side, however since then he has had abdominal pain.  He reports it is worse in the right lower quadrant.  He reports right-sided testicular pain.  He denies any prior abdominal surgeries.  Denies any penile discharge or testicle swelling.  He denies any recent trauma.  He reports that he has not been sexually active "recently".  He denies any penile discharge.  He states that he was concerned as he saw a add on YouTube talking about HIV and lower back pain making him concerned that he may have HIV.       HPI  History reviewed. No pertinent past medical history.  There are no active problems to display for this patient.   History reviewed. No pertinent surgical history.      Home Medications    Prior to Admission medications   Medication Sig Start Date End Date Taking? Authorizing Provider  DM-Doxylamine-Acetaminophen (NYQUIL COLD & FLU PO) Take 30 mLs by mouth at bedtime as needed (sleep).    [provider]  omeprazole (PRILOSEC) 20 MG capsule Take 1 capsule (20 mg total) by mouth daily for 14 days. 07/14/17 07/28/17  Shaune Pollack, MD  ondansetron (ZOFRAN ODT) 4 MG disintegrating tablet Take 1 tablet (4 mg total) by mouth every 8 (eight) hours as needed for nausea or vomiting. 07/14/17   Shaune Pollack, MD  ondansetron (ZOFRAN) 4 MG tablet Take 1 tablet (4 mg  total) by mouth every 6 (six) hours. 12/02/17   Hedges, Tinnie Gens, PA-C  Pseudoephedrine-APAP-DM (DAYQUIL MULTI-SYMPTOM COLD/FLU PO) Take 30 mLs by mouth daily as needed (pain).    [provider]    Family History History reviewed. No pertinent family history.  Social History Social History   Tobacco Use  . Smoking status: Never Smoker  . Smokeless tobacco: Never Used  Substance Use Topics  . Alcohol use: No    Frequency: Never  . Drug use: Yes    Types: Marijuana    Comment: every now and then     Allergies   Patient has no known allergies.   Review of Systems Review of Systems  Constitutional: Negative for chills and fever.  Respiratory: Positive for cough.   Cardiovascular: Positive for chest pain.  Gastrointestinal: Positive for abdominal pain and nausea. Negative for diarrhea and vomiting.  Genitourinary: Positive for difficulty urinating. Negative for hematuria and urgency.  Musculoskeletal: Positive for back pain. Negative for neck pain.  Skin: Negative for color change and rash.  Neurological: Negative for weakness and headaches.  All other systems reviewed and are negative.    Physical Exam Updated Vital Signs BP (!) 147/91 (BP Location: Right Arm)   Pulse 66   Temp 98.5 F (36.9 C) (Oral)   Resp 16   Ht 5\' 8"  (1.727 m)   Wt 90.7 kg   SpO2 100%  BMI 30.41 kg/m   Physical Exam Vitals signs and nursing note reviewed. Exam conducted with a chaperone present (Male ED tech).  Constitutional:      General: He is not in acute distress.    Appearance: He is well-developed. He is not ill-appearing.  HENT:     Head: Normocephalic and atraumatic.  Eyes:     Conjunctiva/sclera: Conjunctivae normal.  Neck:     Musculoskeletal: Normal range of motion and neck supple.  Cardiovascular:     Rate and Rhythm: Normal rate and regular rhythm.     Pulses: Normal pulses.     Heart sounds: Normal heart sounds. No murmur.  Pulmonary:     Effort: Pulmonary  effort is normal. No respiratory distress.     Breath sounds: Normal breath sounds.  Abdominal:     General: Abdomen is flat. Bowel sounds are normal.     Palpations: Abdomen is soft.     Tenderness: There is abdominal tenderness (Diffuse, worst in right lower quadrant). There is no right CVA tenderness or left CVA tenderness.  Genitourinary:    Comments: Bilateral testes without swelling or significant tenderness to palpation. Musculoskeletal:     Right lower leg: No edema.     Left lower leg: No edema.     Comments: There is right-sided lower thoracic and lumbar paraspinal muscle tenderness to palpation.  Palpation here both re-creates and exacerbates his reported back pain.  Skin:    General: Skin is warm and dry.  Neurological:     General: No focal deficit present.     Mental Status: He is alert.  Psychiatric:        Mood and Affect: Mood is anxious.        Behavior: Behavior is withdrawn.     Comments: Poor eye contact      ED Treatments / Results  Labs (all labs ordered are listed, but only abnormal results are displayed) Labs Reviewed  URINALYSIS, ROUTINE W REFLEX MICROSCOPIC - Abnormal; Notable for the following components:      Result Value   Ketones, ur 40 (*)    All other components within normal limits  COMPREHENSIVE METABOLIC PANEL - Abnormal; Notable for the following components:   Total Protein 8.3 (*)    All other components within normal limits  CBC WITH DIFFERENTIAL/PLATELET - Abnormal; Notable for the following components:   RBC 6.28 (*)    Hemoglobin 17.6 (*)    HCT 52.8 (*)    All other components within normal limits  LIPASE, BLOOD  HIV ANTIBODY (ROUTINE TESTING W REFLEX)  RPR  GC/CHLAMYDIA PROBE AMP ([place]) NOT AT [facility]    EKG None  Radiology Ct Abdomen Pelvis W Contrast  Result Date: 04/04/2019 CLINICAL DATA:  Patient with bilateral flank pain for 1 week. EXAM: CT ABDOMEN AND PELVIS WITH CONTRAST TECHNIQUE: Multidetector CT imaging of  the abdomen and pelvis was performed using the standard protocol following bolus administration of intravenous contrast. CONTRAST:  160mL OMNIPAQUE IOHEXOL 300 MG/ML  SOLN COMPARISON:  CT abdomen pelvis Dec 02, 2017 FINDINGS: Lower chest: Normal heart size. Lung bases are clear. No pleural effusion. Hepatobiliary: Liver is normal in size and contour. No focal hepatic lesions identified. Gallbladder is unremarkable. No intrahepatic or extrahepatic biliary ductal dilatation. Pancreas: Unremarkable Spleen: Unremarkable Adrenals/Urinary Tract: Normal adrenal glands. Kidneys enhance symmetrically with contrast. No hydronephrosis. Urinary bladder is unremarkable. Stomach/Bowel: Normal morphology of the stomach. No evidence for small bowel obstruction. No free fluid or free intraperitoneal  air. Normal appendix. Vascular/Lymphatic: Normal caliber abdominal aorta. No retroperitoneal lymphadenopathy. Reproductive: Heterogeneous prostate. Other: None. Musculoskeletal: No aggressive or acute appearing osseous lesions. IMPRESSION: No acute process within the abdomen or pelvis. Electronically Signed   By: Annia Beltrew  Davis M.D.   On: 04/04/2019 16:48    Procedures Procedures (including critical care time)  Medications Ordered in ED Medications  iohexol (OMNIPAQUE) 300 MG/ML solution 100 mL (100 mLs Intravenous Contrast Given 04/04/19 1630)  sodium chloride 0.9 % bolus 1,000 mL (0 mLs Intravenous Stopped 04/04/19 1812)     Initial Impression / Assessment and Plan / ED Course  I have reviewed the triage vital signs and the nursing notes.  Pertinent labs & imaging results that were available during my care of the patient were reviewed by me and considered in my medical decision making (see chart for details).       Patient presents today for evaluation of multiple complaints.  His primary complaint is lower back pain and he became anxious after he saw an ad stating that low back pain could be the only symptom of HIV  infection.  On exam he does have tenderness paraspinally in his lower thoracic and lumbar back which re-creates his back pain.  He does also have right lower quadrant abdominal pain.  Labs are obtained and reviewed, his hemoglobin is slightly elevated at 17.6, however he does not have significant other hematologic or electrolyte derangements.  He does not have a significant leukocytosis.  Lipase is not elevated.  Per his request gonorrhea, chlamydia, HIV, and RPR testing was sent.  Urine shows 40 ketones however is otherwise normal.  Specific gravity is on the higher end at 1.020.  He was given 1 L of IV fluids.  He did also report some testicle pain however his primary concerns today were the abdominal and back pains.  His testicular exam was reassuring without localized testicular tenderness or swelling.  CT scan abdomen pelvis was obtained without evidence of renal stone, appendicitis, or other cause for patient's symptoms seen.  Based on his multitude of complaints, along with the testicular pain being very mild in comparison and a reassuring exam discussed ultrasound which patient declined at this time.    Recommended OTC ibuprofen and Tylenol to treat musculoskeletal related pain.  Return precautions were discussed with patient who states their understanding.  At the time of discharge patient denied any unaddressed complaints or concerns.  Patient is agreeable for discharge home.   Final Clinical Impressions(s) / ED Diagnoses   Final diagnoses:  Acute left-sided thoracic back pain  Right lower quadrant abdominal pain    ED Discharge Orders    None       Norman ClayHammond, Sheneika Walstad W, PA-C 04/04/19 2030    Terrilee FilesButler, Michael C, MD 04/05/19 [phone]

## 2019-04-04 NOTE — ED Triage Notes (Addendum)
Pt here with bilateral lower back pain x 1 week.

## 2019-04-04 NOTE — ED Notes (Signed)
Patient ambulated to CT and back without difficulty

## 2019-04-04 NOTE — Discharge Instructions (Addendum)
Today your blood work, and CT scan were reassuring.  Your urine showed that you had not been eating much recently and your blood work looked like you are slightly dehydrated.  You may take ibuprofen and Tylenol as described below.  I suspect that your back pain is muscle related.  If your symptoms worsen, you develop fevers, your testicular pain worsens or you have any concerns please seek additional medical care and evaluation.  If you need to be seen again for any testicular related concerns please go to [facility] in [place].  Please take Ibuprofen (Advil, motrin) and Tylenol (acetaminophen) to relieve your pain.  You may take up to 600 MG (3 pills) of normal strength ibuprofen every 8 hours as needed.  In between doses of ibuprofen you make take tylenol, up to 1,000 mg (two extra strength pills).  Do not take more than 3,000 mg tylenol in a 24 hour period.  Please check all medication labels as many medications such as pain and cold medications may contain tylenol.  Do not drink alcohol while taking these medications.  Do not take other NSAID'S while taking ibuprofen (such as aleve or naproxen).  Please take ibuprofen with food to decrease stomach upset.

## 2019-04-05 LAB — RPR: RPR Ser Ql: NONREACTIVE

## 2019-04-06 LAB — HIV ANTIBODY (ROUTINE TESTING W REFLEX): HIV Screen 4th Generation wRfx: NONREACTIVE

## 2019-04-07 LAB — GC/CHLAMYDIA PROBE AMP (~~LOC~~) NOT AT ARMC
Chlamydia: NEGATIVE
Neisseria Gonorrhea: NEGATIVE

## 2019-05-24 ENCOUNTER — Encounter (HOSPITAL_COMMUNITY): Payer: Self-pay

## 2019-05-24 ENCOUNTER — Emergency Department (HOSPITAL_COMMUNITY)
Admission: EM | Admit: 2019-05-24 | Discharge: 2019-05-24 | Disposition: A | Discharge status: Home / Self Care | Payer: Self-pay | Attending: Emergency Medicine | Admitting: Emergency Medicine

## 2019-05-24 DIAGNOSIS — R300 Dysuria: Secondary | ICD-10-CM | POA: Insufficient documentation

## 2019-05-24 LAB — URINALYSIS, ROUTINE W REFLEX MICROSCOPIC
Bilirubin Urine: NEGATIVE
Glucose, UA: NEGATIVE mg/dL
Hgb urine dipstick: NEGATIVE
Ketones, ur: NEGATIVE mg/dL
Leukocytes,Ua: NEGATIVE
Nitrite: NEGATIVE
Protein, ur: 30 mg/dL — AB
Specific Gravity, Urine: 1.03 (ref 1.005–1.030)
pH: 7 (ref 5.0–8.0)

## 2019-05-24 MED ORDER — LIDOCAINE HCL (PF) 1 % IJ SOLN
INTRAMUSCULAR | Status: AC
  Administered 2019-05-24: 2.1 mL
  Filled 2019-05-24: qty 5

## 2019-05-24 MED ORDER — CEFTRIAXONE SODIUM 250 MG IJ SOLR
250.0000 mg | Freq: Once | INTRAMUSCULAR | Status: AC
  Administered 2019-05-24: 19:00:00 250 mg via INTRAMUSCULAR
  Filled 2019-05-24: qty 250

## 2019-05-24 MED ORDER — AZITHROMYCIN 1 G PO PACK
1.0000 g | PACK | Freq: Once | ORAL | Status: AC
  Administered 2019-05-24: 1 g via ORAL
  Filled 2019-05-24: qty 1

## 2019-05-24 NOTE — Discharge Instructions (Signed)
Abstain from intercourse until you know your test results. Call the hospital in 3-5 days for your results. IF your gonorrhea and chlamydia tests are negative, you may resume intercourse. IF either test is positive, your partner will need to go to the health department for free treatment. IF your test is positive, both partners need to abstain from intercourse for 10 days after treatment (this includes all forms of intercourse).  ° °

## 2019-05-24 NOTE — ED Triage Notes (Signed)
Pt reports having had sexual intercourse with a woman suspected of having an STD a week ago. Pt reports dysuria. Pt denies discharge or sores on penis.

## 2019-05-24 NOTE — ED Provider Notes (Signed)
[facility] EMERGENCY DEPARTMENT Provider Note   CSN: [id number] Arrival date & time: 05/24/19  1726     History   Chief Complaint Chief Complaint  Patient presents with  . Dysuria    HPI Leron Stoffers is a 23 y.o. male.     23 year old male presents with complaint of dysuria and concern for STD.  Patient states that he had intercourse 1 week ago and the condom broke, developed dysuria a few days ago.  Patient denies any rashes, lesions, penile swelling or pain, testicular pain or swelling or urethral discharge.  No other complaints or concerns today.     History reviewed. No pertinent past medical history.  There are no active problems to display for this patient.   History reviewed. No pertinent surgical history.      Home Medications    Prior to Admission medications   Medication Sig Start Date End Date Taking? Authorizing Provider  DM-Doxylamine-Acetaminophen (NYQUIL COLD & FLU PO) Take 30 mLs by mouth at bedtime as needed (sleep).    [provider]  omeprazole (PRILOSEC) 20 MG capsule Take 1 capsule (20 mg total) by mouth daily for 14 days. 07/14/17 07/28/17  Duffy Bruce, MD  ondansetron (ZOFRAN ODT) 4 MG disintegrating tablet Take 1 tablet (4 mg total) by mouth every 8 (eight) hours as needed for nausea or vomiting. 07/14/17   Duffy Bruce, MD  ondansetron (ZOFRAN) 4 MG tablet Take 1 tablet (4 mg total) by mouth every 6 (six) hours. 12/02/17   Hedges, Dellis Filbert, PA-C  Pseudoephedrine-APAP-DM (DAYQUIL MULTI-SYMPTOM COLD/FLU PO) Take 30 mLs by mouth daily as needed (pain).    [provider]    Family History History reviewed. No pertinent family history.  Social History Social History   Tobacco Use  . Smoking status: Never Smoker  . Smokeless tobacco: Never Used  Substance Use Topics  . Alcohol use: No    Frequency: Never  . Drug use: Yes    Types: Marijuana    Comment: every now and then      Allergies   Patient has no known allergies.   Review of Systems Review of Systems  Constitutional: Negative for fever.  Gastrointestinal: Negative for abdominal pain.  Genitourinary: Positive for dysuria. Negative for decreased urine volume, difficulty urinating, discharge, flank pain, genital sores, hematuria, penile pain, penile swelling, scrotal swelling and testicular pain.  Musculoskeletal: Negative for back pain.  Skin: Negative for color change, rash and wound.  Allergic/Immunologic: Negative for immunocompromised state.  Hematological: Negative for adenopathy.  All other systems reviewed and are negative.    Physical Exam Updated Vital Signs BP 127/82 (BP Location: Right Arm)   Pulse (!) 103   Temp 98.6 F (37 C) (Oral)   Resp 16   Ht 5\' 9"  (1.753 m)   Wt 90.7 kg   SpO2 99%   BMI 29.53 kg/m   Physical Exam Vitals signs and nursing note reviewed.  Constitutional:      General: He is not in acute distress.    Appearance: He is well-developed. He is not diaphoretic.  HENT:     Head: Normocephalic and atraumatic.  Cardiovascular:     Rate and Rhythm: Normal rate and regular rhythm.     Pulses: Normal pulses.     Heart sounds: Normal heart sounds. No murmur.  Pulmonary:     Effort: Pulmonary effort is normal.  Abdominal:     Palpations: Abdomen is soft.     Tenderness: There is  no abdominal tenderness. There is no right CVA tenderness or left CVA tenderness.  Skin:    General: Skin is warm and dry.     Findings: No erythema or rash.  Neurological:     Mental Status: He is alert and oriented to person, place, and time.  Psychiatric:        Behavior: Behavior normal.    ED Treatments / Results  Labs (all labs ordered are listed, but only abnormal results are displayed) Labs Reviewed  URINALYSIS, ROUTINE W REFLEX MICROSCOPIC  GC/CHLAMYDIA PROBE AMP ([place]) NOT AT [facility]    EKG None  Radiology No results found.  Procedures Procedures  (including critical care time)  Medications Ordered in ED Medications  azithromycin (ZITHROMAX) powder 1 g (has no administration in time range)  cefTRIAXone (ROCEPHIN) injection 250 mg (has no administration in time range)     Initial Impression / Assessment and Plan / ED Course  I have reviewed the triage vital signs and the nursing notes.  Pertinent labs & imaging results that were available during my care of the patient were reviewed by me and considered in my medical decision making (see chart for details).  Clinical Course as of May 23 1848  Wynelle Link May 24, 2019  454 23 year old male with complaint of dysuria, concerned he may have STD after a condom broke 1 week ago.  Patient denies urethral discharge or skin changes.  Patient had negative STD and HIV testing 2 months ago in the department.  Urinalysis sent out as well as GC chlamydia. Patient was given Rocephin and Zithromax, advised to abstain from intercourse until he knows his results and partner(s) treated if needed. Care signed out awaiting UA.    [LM]    Clinical Course User Index [LM] Jeannie Fend, PA-C      Final Clinical Impressions(s) / ED Diagnoses   Final diagnoses:  Dysuria    ED Discharge Orders    None       Jeannie Fend, PA-C 05/24/19 1850    Terald Sleeper, MD 05/25/19 1133

## 2019-05-26 LAB — GC/CHLAMYDIA PROBE AMP (~~LOC~~) NOT AT ARMC
Chlamydia: NEGATIVE
Neisseria Gonorrhea: NEGATIVE

## 2020-07-01 ENCOUNTER — Other Ambulatory Visit: Payer: Self-pay

## 2020-07-01 ENCOUNTER — Emergency Department (HOSPITAL_COMMUNITY)
Admission: EM | Admit: 2020-07-01 | Discharge: 2020-07-01 | Disposition: A | Discharge status: Home / Self Care | Payer: Self-pay | Attending: Emergency Medicine | Admitting: Emergency Medicine

## 2020-07-01 ENCOUNTER — Encounter (HOSPITAL_COMMUNITY): Payer: Self-pay | Admitting: Emergency Medicine

## 2020-07-01 DIAGNOSIS — R202 Paresthesia of skin: Secondary | ICD-10-CM | POA: Insufficient documentation

## 2020-07-01 MED ORDER — IBUPROFEN 600 MG PO TABS: 600.0000 mg | ORAL_TABLET | Freq: Four times a day (QID) | ORAL | 0 refills | Status: AC | PRN

## 2020-07-01 NOTE — ED Triage Notes (Signed)
Pt c/o lip skin irritation since Monday not getting better.

## 2020-07-01 NOTE — ED Provider Notes (Signed)
[facility] EMERGENCY DEPARTMENT Provider Note   CSN: [id number] Arrival date & time: 07/01/20  1149     History Chief Complaint  Patient presents with  . skin redness.    Jason Stevens is a 24 y.o. male presented to emergency department with complaints of tingling and burning on the left side of his mouth.  He reports this began gradually 5 days ago on Monday.  He has never had these symptoms before.  He notes it is worse when he touches the edge of his lip and brushes his mustache.  He denies fevers or chills.  He denies history of cold sores.  He is most concerned that he may have acquired oral herpes.  He does have a relationship partner.  HPI     History reviewed. No pertinent past medical history.  There are no problems to display for this patient.  History reviewed. No pertinent surgical history.    No family history on file.  Social History   Tobacco Use  . Smoking status: Never Smoker  . Smokeless tobacco: Never Used  Substance Use Topics  . Alcohol use: No  . Drug use: Yes    Types: Marijuana    Comment: every now and then    Home Medications Prior to Admission medications   Medication Sig Start Date End Date Taking? Authorizing Provider  DM-Doxylamine-Acetaminophen (NYQUIL COLD & FLU PO) Take 30 mLs by mouth at bedtime as needed (sleep).    [provider]  ibuprofen (ADVIL) 600 MG tablet Take 1 tablet (600 mg total) by mouth every 6 (six) hours as needed for up to 30 doses for mild pain or moderate pain. Take with food 07/01/20   Terald Sleeper, MD  omeprazole (PRILOSEC) 20 MG capsule Take 1 capsule (20 mg total) by mouth daily for 14 days. 07/14/17 07/28/17  Shaune Pollack, MD  ondansetron (ZOFRAN ODT) 4 MG disintegrating tablet Take 1 tablet (4 mg total) by mouth every 8 (eight) hours as needed for nausea or vomiting. 07/14/17   Shaune Pollack, MD  ondansetron (ZOFRAN) 4 MG tablet Take 1 tablet (4 mg total) by mouth  every 6 (six) hours. 12/02/17   Hedges, Tinnie Gens, PA-C  Pseudoephedrine-APAP-DM (DAYQUIL MULTI-SYMPTOM COLD/FLU PO) Take 30 mLs by mouth daily as needed (pain).    [provider]    Allergies    Patient has no known allergies.  Review of Systems   Review of Systems  Constitutional: Negative for chills and fever.  Respiratory: Negative for cough and shortness of breath.   Cardiovascular: Negative for chest pain and palpitations.  Musculoskeletal: Negative for arthralgias and back pain.  Skin: Negative for color change and rash.  All other systems reviewed and are negative.   Physical Exam Updated Vital Signs BP 121/77 (BP Location: Right Arm)   Pulse 77   Temp 97.8 F (36.6 C) (Oral)   Resp 16   Ht 5\' 8"  (1.727 m)   Wt 90.7 kg   SpO2 95%   BMI 30.41 kg/m   Physical Exam Vitals and nursing note reviewed.  Constitutional:      Appearance: He is well-developed and well-nourished.  HENT:     Head: Normocephalic and atraumatic.     Mouth/Throat:     Comments: No visible lesions on oral exam no paresthesias Eyes:     Conjunctiva/sclera: Conjunctivae normal.  Cardiovascular:     Rate and Rhythm: Normal rate and regular rhythm.  Pulmonary:     Effort: Pulmonary  effort is normal. No respiratory distress.  Musculoskeletal:        General: No edema.     Cervical back: Neck supple.  Skin:    General: Skin is warm and dry.  Neurological:     Mental Status: He is alert.  Psychiatric:        Mood and Affect: Mood and affect normal.     ED Results / Procedures / Treatments   Labs (all labs ordered are listed, but only abnormal results are displayed) Labs Reviewed - No data to display  EKG None  Radiology No results found.  Procedures Procedures (including critical care time)  Medications Ordered in ED Medications - No data to display  ED Course  I have reviewed the triage vital signs and the nursing notes.  Pertinent labs & imaging results that were  available during my care of the patient were reviewed by me and considered in my medical decision making (see chart for details).  24 year old recent emerge department with burning sensation on the lateral aspect of his left lip.  His benign neurological exam.  Do not see any obvious herpes lesions in the oropharynx.  I explained it could be early herpes simplex outbreak, there is no way to know for sure at this time.  We will start him on Motrin for a few days.  Explained to him the most of adult population carries this virus in their system dormant with occasional outbreaks, but this is not life-threatening.  Okay for discharge     Final Clinical Impression(s) / ED Diagnoses Final diagnoses:  Paresthesia of lower lip    Rx / DC Orders ED Discharge Orders         Ordered    ibuprofen (ADVIL) 600 MG tablet  Every 6 hours PRN        07/01/20 1232           Terald Sleeper, MD 07/01/20 1303

## 2020-07-01 NOTE — Discharge Instructions (Addendum)
I explained it is possible this may be an early herpes cold sore outbreak.  I did not diagnosis at this time, as you have no sores. I advised that you take Motrin for several days for your pain.  Keep an eye on your lips.  I included some information to read over if you do breakout with cold sores.  This is not a life threatening condition, and typically resolves after several days.  The motrin is AS NEEDED for pain.  If your pain is tolerable, you do not need to take any Motrin.  Please try to take it with a little bit of food so it does not upset your stomach.

## 2020-07-02 ENCOUNTER — Encounter (HOSPITAL_COMMUNITY): Payer: Self-pay | Admitting: Emergency Medicine

## 2020-07-02 ENCOUNTER — Emergency Department (HOSPITAL_COMMUNITY)
Admission: EM | Admit: 2020-07-02 | Discharge: 2020-07-02 | Disposition: A | Discharge status: Home / Self Care | Payer: Self-pay | Attending: Emergency Medicine | Admitting: Emergency Medicine

## 2020-07-02 ENCOUNTER — Other Ambulatory Visit: Payer: Self-pay

## 2020-07-02 DIAGNOSIS — K1379 Other lesions of oral mucosa: Secondary | ICD-10-CM | POA: Insufficient documentation

## 2020-07-02 DIAGNOSIS — Z5321 Procedure and treatment not carried out due to patient leaving prior to being seen by health care provider: Secondary | ICD-10-CM | POA: Insufficient documentation

## 2020-07-02 NOTE — ED Notes (Signed)
Patient called for room placement x3 with no answer. 

## 2020-07-02 NOTE — ED Notes (Signed)
Patient called for room placement x2 with no answer. 

## 2020-07-02 NOTE — ED Notes (Signed)
Patient called for room placement x1 with no answer. 

## 2020-07-02 NOTE — ED Triage Notes (Addendum)
Patient c/o irritation to left lip x1 week.

## 2020-07-06 ENCOUNTER — Emergency Department (HOSPITAL_COMMUNITY)
Admission: EM | Admit: 2020-07-06 | Discharge: 2020-07-06 | Disposition: A | Discharge status: Home / Self Care | Payer: Self-pay | Attending: Emergency Medicine | Admitting: Emergency Medicine

## 2020-07-06 ENCOUNTER — Encounter (HOSPITAL_COMMUNITY): Payer: Self-pay | Admitting: Emergency Medicine

## 2020-07-06 ENCOUNTER — Emergency Department (HOSPITAL_COMMUNITY): Payer: Self-pay

## 2020-07-06 ENCOUNTER — Other Ambulatory Visit: Payer: Self-pay

## 2020-07-06 DIAGNOSIS — N451 Epididymitis: Secondary | ICD-10-CM | POA: Insufficient documentation

## 2020-07-06 LAB — URINALYSIS, ROUTINE W REFLEX MICROSCOPIC
Bilirubin Urine: NEGATIVE
Glucose, UA: NEGATIVE mg/dL
Hgb urine dipstick: NEGATIVE
Ketones, ur: 5 mg/dL — AB
Leukocytes,Ua: NEGATIVE
Nitrite: NEGATIVE
Protein, ur: NEGATIVE mg/dL
Specific Gravity, Urine: 1.018 (ref 1.005–1.030)
pH: 6 (ref 5.0–8.0)

## 2020-07-06 MED ORDER — IBUPROFEN 600 MG PO TABS: 600.0000 mg | ORAL_TABLET | Freq: Four times a day (QID) | ORAL | 0 refills | Status: AC

## 2020-07-06 MED ORDER — DOXYCYCLINE HYCLATE 100 MG PO CAPS: 100.0000 mg | ORAL_CAPSULE | Freq: Two times a day (BID) | ORAL | 0 refills | Status: DC

## 2020-07-06 MED ORDER — LIDOCAINE HCL (PF) 1 % IJ SOLN
1.0000 mL | Freq: Once | INTRAMUSCULAR | Status: AC
  Administered 2020-07-06: 22:00:00 1 mL
  Filled 2020-07-06: qty 30

## 2020-07-06 MED ORDER — CEFTRIAXONE SODIUM 1 G IJ SOLR
500.0000 mg | Freq: Once | INTRAMUSCULAR | Status: AC
  Administered 2020-07-06: 22:00:00 500 mg via INTRAMUSCULAR
  Filled 2020-07-06: qty 10

## 2020-07-06 NOTE — Discharge Instructions (Addendum)
  Antiinflammatory medications: Take 600 mg of ibuprofen every 6 hours or 440 mg (over the counter dose) to 500 mg (prescription dose) of naproxen every 12 hours for the next 3 days. After this time, these medications may be used as needed for pain. Take these medications with food to avoid upset stomach. Choose only one of these medications, do not take them together. Acetaminophen (generic for Tylenol): Should you continue to have additional pain while taking the ibuprofen or naproxen, you may add in acetaminophen as needed. Your daily total maximum amount of acetaminophen from all sources should be limited to 4000mg /day for persons without liver problems, or 2000mg /day for those with liver problems.  Please take all of your antibiotics until finished!   You may develop abdominal discomfort or diarrhea from the antibiotic.  You may help offset this with probiotics which you can buy or get in yogurt. Do not eat or take the probiotics until 2 hours after your antibiotic.   Hydration: Symptoms of most illnesses will be intensified and complicated by dehydration. Dehydration can also extend the duration of symptoms. Drink plenty of fluids and get plenty of rest. You should be drinking at least half a liter of water an hour to stay hydrated. Electrolyte drinks (ex. Gatorade, Powerade, Pedialyte) are also encouraged. You should be drinking enough fluids to make your urine light yellow, almost clear. If this is not the case, you are not drinking enough water. Please note that some of the treatments indicated above will not be effective if you are not adequately hydrated.  Follow-up: Recommend follow-up with urology on this matter.  Call the number provided to set up an appointment.  Return: Return to the emergency department for abdominal pain, persistent vomiting, fever, worsening pain, difficulty urinating, or any other major concerns.

## 2020-07-06 NOTE — ED Notes (Signed)
US at bedside

## 2020-07-06 NOTE — ED Provider Notes (Signed)
[facility] Provider Note   CSN: [id number] Arrival date & time: 07/06/20  1614     History Chief Complaint  Patient presents with  . Testicle Pain    Jason Stevens is a 24 y.o. male.  HPI      Jason Stevens is a 24 y.o. male, presenting to the ED with bilateral testicular pain for the past week. He states when he came to the ED with similar symptoms last year he was treated for STDs. He is currently sexually active with 2 partners, but uses condoms.  Denies anal intercourse.  Denies fever/chills, nausea/vomiting, pain with bowel movements, difficulty with bowel movements, hematuria, dysuria, difficulty urinating, scrotal swelling, penile discharge, or any other complaints.   No past medical history on file.  There are no problems to display for this patient.   No past surgical history on file.     No family history on file.  Social History   Tobacco Use  . Smoking status: Never Smoker  . Smokeless tobacco: Never Used  Substance Use Topics  . Alcohol use: No  . Drug use: Yes    Types: Marijuana    Comment: every now and then    Home Medications Prior to Admission medications   Medication Sig Start Date End Date Taking? Authorizing Provider  DM-Doxylamine-Acetaminophen (NYQUIL COLD & FLU PO) Take 30 mLs by mouth at bedtime as needed (sleep).    [provider]  doxycycline (VIBRAMYCIN) 100 MG capsule Take 1 capsule (100 mg total) by mouth 2 (two) times daily. 07/06/20   Jossette Zirbel C, PA-C  ibuprofen (ADVIL) 600 MG tablet Take 1 tablet (600 mg total) by mouth every 6 (six) hours as needed for up to 30 doses for mild pain or moderate pain. Take with food 07/01/20   Terald Sleeper, MD  ibuprofen (ADVIL) 600 MG tablet Take 1 tablet (600 mg total) by mouth every 6 (six) hours for 10 days. 07/06/20 07/16/20  Rodina Pinales C, PA-C  omeprazole (PRILOSEC) 20 MG capsule Take 1 capsule (20 mg total) by mouth  daily for 14 days. 07/14/17 07/28/17  Shaune Pollack, MD  ondansetron (ZOFRAN ODT) 4 MG disintegrating tablet Take 1 tablet (4 mg total) by mouth every 8 (eight) hours as needed for nausea or vomiting. 07/14/17   Shaune Pollack, MD  ondansetron (ZOFRAN) 4 MG tablet Take 1 tablet (4 mg total) by mouth every 6 (six) hours. 12/02/17   Hedges, Tinnie Gens, PA-C  Pseudoephedrine-APAP-DM (DAYQUIL MULTI-SYMPTOM COLD/FLU PO) Take 30 mLs by mouth daily as needed (pain).    [provider]    Allergies    Patient has no known allergies.  Review of Systems   Review of Systems  Constitutional: Negative for chills and fever.  Gastrointestinal: Negative for abdominal pain, blood in stool, constipation, nausea and vomiting.  Genitourinary: Positive for testicular pain. Negative for difficulty urinating, dysuria, flank pain, frequency, hematuria, penile discharge, penile swelling and scrotal swelling.  Neurological: Negative for weakness.  All other systems reviewed and are negative.   Physical Exam Updated Vital Signs BP 131/89 (BP Location: Right Arm)   Pulse 88   Temp 97.9 F (36.6 C) (Oral)   Resp 16   SpO2 97%   Physical Exam Vitals and nursing note reviewed. Exam conducted with a chaperone present.  Constitutional:      General: He is not in acute distress.    Appearance: He is well-developed. He is not diaphoretic.  HENT:  Head: Normocephalic and atraumatic.     Mouth/Throat:     Mouth: Mucous membranes are moist.     Pharynx: Oropharynx is clear.  Eyes:     Conjunctiva/sclera: Conjunctivae normal.  Cardiovascular:     Rate and Rhythm: Normal rate and regular rhythm.     Pulses: Normal pulses.          Radial pulses are 2+ on the right side and 2+ on the left side.  Pulmonary:     Effort: Pulmonary effort is normal. No respiratory distress.  Abdominal:     Palpations: Abdomen is soft.     Tenderness: There is no abdominal tenderness. There is no guarding.   Genitourinary:    Comments: Tenderness to the left superior testicle without noted swelling, deformity, shortening, erythema to the scrotum. No tenderness, swelling, lesions, or other abnormalities noted to the rest of the scrotum or penis. Some tenderness to the perineum without swelling. Both testicles are readily mobile. No noted obvious masses.  Rectal Exam:  No external hemorrhoids, fissures, or lesions noted.  No frank blood or melena. No stool burden.  No rectal tenderness. No foreign bodies noted.   No noted prostatic enlargement, bogginess, or tenderness. Musculoskeletal:     Cervical back: Neck supple.  Skin:    General: Skin is warm and dry.  Neurological:     Mental Status: He is alert.  Psychiatric:        Mood and Affect: Mood and affect normal.        Speech: Speech normal.        Behavior: Behavior normal.     ED Results / Procedures / Treatments   Labs (all labs ordered are listed, but only abnormal results are displayed) Labs Reviewed  URINALYSIS, ROUTINE W REFLEX MICROSCOPIC - Abnormal; Notable for the following components:      Result Value   APPearance HAZY (*)    Ketones, ur 5 (*)    All other components within normal limits  URINE CULTURE  RPR  HIV ANTIBODY (ROUTINE TESTING W REFLEX)  GC/CHLAMYDIA PROBE AMP ([facility]) NOT AT [facility]    EKG None  Radiology US SCROTUM W/DOPPLER  Result Date: 07/06/2020 CLINICAL DATA:  24 year old male with left testicular pain. EXAM: SCROTAL ULTRASOUND DOPPLER ULTRASOUND OF THE TESTICLES TECHNIQUE: Complete ultrasound examination of the testicles, epididymis, and other scrotal structures was performed. Color and spectral Doppler ultrasound were also utilized to evaluate blood flow to the testicles. COMPARISON:  Testicular ultrasound dated 05/26/2017. FINDINGS: Right testicle Measurements: 4.4 x 2.0 x 3.4 cm. No mass or microlithiasis visualized. Left testicle Measurements: 4.0 x 2.3 x 3.2 cm. No mass or  microlithiasis visualized. Right epididymis:  Normal in size and appearance. Left epididymis: There is a 1 cm left epididymal head cyst. There may be mild enlargement of the left epididymis. The left epididymis is suboptimally visualized. Correlation with clinical exam and urinalysis recommended to evaluate for possibility of left-sided epididymitis. Hydrocele:  None visualized. Varicocele:  None visualized. Pulsed Doppler interrogation of both testes demonstrates normal low resistance arterial and venous waveforms bilaterally. IMPRESSION: 1. Unremarkable testicles. 2. Possible mild left epididymitis. Clinical correlation is recommended. Electronically Signed   By: Elgie Collard M.D.   On: 07/06/2020 20:19    Procedures Procedures (including critical care time)  Medications Ordered in ED Medications  cefTRIAXone (ROCEPHIN) injection 500 mg (500 mg Intramuscular Given 07/06/20 2154)  lidocaine (PF) (XYLOCAINE) 1 % injection 1 mL (1 mL Other Given 07/06/20 2153)  ED Course  I have reviewed the triage vital signs and the nursing notes.  Pertinent labs & imaging results that were available during my care of the patient were reviewed by me and considered in my medical decision making (see chart for details).    MDM Rules/Calculators/A&P                          Patient presents with testicular pain for the past week. Patient is nontoxic appearing, afebrile, not tachycardic, not tachypneic, not hypotensive, and is in no apparent distress.   I have reviewed the patient's chart to obtain more information.  Ultrasound with evidence of possible epididymitis.  Based on patient's report of his symptoms and physical exam findings, I do think epididymitis is likely.  I have a lower suspicion for additional or alternative diagnoses, such as prostatitis, UTI, or even STD.  However, based on recommendations on up-to-date, we will add additional treatment for possible STDs.  When I reviewed the  patient's chart from his previous visits, it does not look as though patient actually had a positive STD test, rather he was preemptively treated based on symptoms.   The patient was given instructions for home care as well as return precautions. Patient voices understanding of these instructions, accepts the plan, and is comfortable with discharge.   Vitals:   07/06/20 1625 07/06/20 2014 07/06/20 2155  BP: 131/89 125/75 130/84  Pulse: 88 78 72  Resp: 16 16 16   Temp: 97.9 F (36.6 C)  98 F (36.7 C)  TempSrc: Oral  Oral  SpO2: 97% 98% 98%     Final Clinical Impression(s) / ED Diagnoses Final diagnoses:  Epididymitis    Rx / DC Orders ED Discharge Orders         Ordered    ibuprofen (ADVIL) 600 MG tablet  Every 6 hours        07/06/20 2144    doxycycline (VIBRAMYCIN) 100 MG capsule  2 times daily        07/06/20 2144           2145 07/06/20 2339    2340, MD 07/11/20 [phone]

## 2020-07-06 NOTE — ED Triage Notes (Addendum)
24 yo male presents today complaining of testicular pain bilaterally x one week. Pt states testicles are swollen, but denies any erythema.Pt denies penile discharge, hematuria, dysuria, or any penile lesions. Pt denies fever or vomiting, but pt does endorse some nausea. Pt denies any recent heavy lifting. Pt states he has had this testicular swelling once in the past and was diagnosed with chlamydia approximately 4 years ago. Pt states he is currently sexually active but has been using protection.

## 2020-07-07 LAB — RPR: RPR Ser Ql: NONREACTIVE

## 2020-07-07 LAB — HIV ANTIBODY (ROUTINE TESTING W REFLEX): HIV Screen 4th Generation wRfx: NONREACTIVE

## 2020-07-08 LAB — URINE CULTURE: Culture: NO GROWTH

## 2020-08-03 ENCOUNTER — Other Ambulatory Visit: Payer: Self-pay

## 2020-08-03 ENCOUNTER — Emergency Department (HOSPITAL_COMMUNITY)
Admission: EM | Admit: 2020-08-03 | Discharge: 2020-08-03 | Disposition: A | Discharge status: Home / Self Care | Payer: Self-pay | Attending: Emergency Medicine | Admitting: Emergency Medicine

## 2020-08-03 DIAGNOSIS — R300 Dysuria: Secondary | ICD-10-CM | POA: Insufficient documentation

## 2020-08-03 LAB — URINALYSIS, ROUTINE W REFLEX MICROSCOPIC
Bilirubin Urine: NEGATIVE
Glucose, UA: NEGATIVE mg/dL
Hgb urine dipstick: NEGATIVE
Ketones, ur: NEGATIVE mg/dL
Leukocytes,Ua: NEGATIVE
Nitrite: NEGATIVE
Protein, ur: 30 mg/dL — AB
Specific Gravity, Urine: 1.026 (ref 1.005–1.030)
pH: 6 (ref 5.0–8.0)

## 2020-08-03 LAB — RAPID HIV SCREEN (HIV 1/2 AB+AG)
HIV 1/2 Antibodies: NONREACTIVE
HIV-1 P24 Antigen - HIV24: NONREACTIVE

## 2020-08-03 MED ORDER — DOXYCYCLINE HYCLATE 100 MG PO CAPS: 100.0000 mg | ORAL_CAPSULE | Freq: Two times a day (BID) | ORAL | 0 refills | Status: DC

## 2020-08-03 MED ORDER — LIDOCAINE HCL (PF) 1 % IJ SOLN: 2.0000 mL | Freq: Once | INTRAMUSCULAR | Status: DC

## 2020-08-03 MED ORDER — DOXYCYCLINE HYCLATE 100 MG PO TABS
100.0000 mg | ORAL_TABLET | Freq: Once | ORAL | Status: AC
  Administered 2020-08-03: 100 mg via ORAL
  Filled 2020-08-03: qty 1

## 2020-08-03 MED ORDER — CEFTRIAXONE SODIUM 500 MG IJ SOLR
250.0000 mg | Freq: Once | INTRAMUSCULAR | Status: AC
  Administered 2020-08-03: 250 mg via INTRAMUSCULAR
  Filled 2020-08-03: qty 500

## 2020-08-03 NOTE — ED Provider Notes (Signed)
[facility] EMERGENCY DEPARTMENT Provider Note   CSN: [id number] Arrival date & time: 08/03/20  1750     History No chief complaint on file.   Jason Stevens is a 25 y.o. male.  HPI   25 year old male presents emergency department dysuria.  Patient states has been going on for the past couple days.  He had sexual contact with a partner who he states has concerned that she has STDs.  He is now requesting to be tested for STDs including hepatitis and HIV.  He denies any penile swelling, testicular pain, testicular swelling, rash.  Denies any hematuria.  No past medical history on file.  There are no problems to display for this patient.   No past surgical history on file.     No family history on file.  Social History   Tobacco Use  . Smoking status: Never Smoker  . Smokeless tobacco: Never Used  Substance Use Topics  . Alcohol use: No  . Drug use: Yes    Types: Marijuana    Comment: every now and then    Home Medications Prior to Admission medications   Medication Sig Start Date End Date Taking? Authorizing Provider  DM-Doxylamine-Acetaminophen (NYQUIL COLD & FLU PO) Take 30 mLs by mouth at bedtime as needed (sleep).    [provider]  doxycycline (VIBRAMYCIN) 100 MG capsule Take 1 capsule (100 mg total) by mouth 2 (two) times daily. 07/06/20   Joy, Shawn C, PA-C  ibuprofen (ADVIL) 600 MG tablet Take 1 tablet (600 mg total) by mouth every 6 (six) hours as needed for up to 30 doses for mild pain or moderate pain. Take with food 07/01/20   Terald Sleeper, MD  omeprazole (PRILOSEC) 20 MG capsule Take 1 capsule (20 mg total) by mouth daily for 14 days. 07/14/17 07/28/17  Shaune Pollack, MD  ondansetron (ZOFRAN ODT) 4 MG disintegrating tablet Take 1 tablet (4 mg total) by mouth every 8 (eight) hours as needed for nausea or vomiting. 07/14/17   Shaune Pollack, MD  ondansetron (ZOFRAN) 4 MG tablet Take 1 tablet (4 mg total) by mouth  every 6 (six) hours. 12/02/17   Hedges, Tinnie Gens, PA-C  Pseudoephedrine-APAP-DM (DAYQUIL MULTI-SYMPTOM COLD/FLU PO) Take 30 mLs by mouth daily as needed (pain).    [provider]    Allergies    Patient has no known allergies.  Review of Systems   Review of Systems  Constitutional: Negative for fever.  Respiratory: Negative for shortness of breath.   Cardiovascular: Negative for chest pain.  Gastrointestinal: Negative for abdominal pain.  Genitourinary: Positive for dysuria. Negative for genital sores, hematuria, penile discharge, penile pain, penile swelling, scrotal swelling and testicular pain.  Skin: Negative for rash.  Neurological: Negative for headaches.    Physical Exam Updated Vital Signs BP (!) 144/94   Pulse 99   Temp 98.4 F (36.9 C)   Resp 18   SpO2 97%   Physical Exam Vitals and nursing note reviewed.  Constitutional:      Appearance: Normal appearance.  HENT:     Head: Normocephalic.     Mouth/Throat:     Mouth: Mucous membranes are moist.  Pulmonary:     Effort: Pulmonary effort is normal. No respiratory distress.  Genitourinary:    Comments: Normal external exam, no penile discharge, testicles are nontender, no gross swelling/rash/sore Skin:    General: Skin is warm.  Neurological:     Mental Status: He is alert and oriented to person,  place, and time. Mental status is at baseline.  Psychiatric:        Mood and Affect: Mood normal.     ED Results / Procedures / Treatments   Labs (all labs ordered are listed, but only abnormal results are displayed) Labs Reviewed  URINALYSIS, ROUTINE W REFLEX MICROSCOPIC - Abnormal; Notable for the following components:      Result Value   Protein, ur 30 (*)    Bacteria, UA RARE (*)    All other components within normal limits  RAPID HIV SCREEN (HIV 1/2 AB+AG)  HEPATITIS PANEL, ACUTE    EKG None  Radiology No results found.  Procedures Procedures (including critical care time)  Medications  Ordered in ED Medications  cefTRIAXone (ROCEPHIN) injection 250 mg (has no administration in time range)  doxycycline (VIBRA-TABS) tablet 100 mg (has no administration in time range)    ED Course  I have reviewed the triage vital signs and the nursing notes.  Pertinent labs & imaging results that were available during my care of the patient were reviewed by me and considered in my medical decision making (see chart for details).    MDM Rules/Calculators/A&P                          25 year old male presents emergency department with dysuria after possible STD exposure.  He is requesting STD and HIV/hepatitis testing.  External genital exam is unremarkable, we will do urine testing, treat the patient prophylactically and send off HIV/hepatitis testing.  Patient will be discharged and treated as an outpatient.  Discharge plan and strict return to ED precautions discussed, patient verbalizes understanding and agreement. Final Clinical Impression(s) / ED Diagnoses Final diagnoses:  None    Rx / DC Orders ED Discharge Orders    None       Rozelle Logan, DO 08/03/20 2221

## 2020-08-03 NOTE — Discharge Instructions (Addendum)
You have been seen and discharged from the emergency department.  Follow-up with your primary provider for reevaluation. Take new prescriptions and home medications as prescribed. If you have any worsening symptoms or further concerns for health please return to an emergency department for further evaluation. 

## 2020-08-03 NOTE — ED Triage Notes (Signed)
Pt here for STD check, endorses burning with urination x 1 week. Denies swelling, pain, or discharge.

## 2020-08-03 NOTE — ED Notes (Signed)
Pt d/c by MD and is provided w/ d/c instructions and follow up care, Pt out of the ED ambulatory by self

## 2020-08-04 ENCOUNTER — Other Ambulatory Visit: Payer: Self-pay

## 2020-08-04 ENCOUNTER — Emergency Department (HOSPITAL_COMMUNITY)
Admission: EM | Admit: 2020-08-04 | Discharge: 2020-08-04 | Disposition: A | Discharge status: Home / Self Care | Payer: HRSA Program | Attending: Emergency Medicine | Admitting: Emergency Medicine

## 2020-08-04 DIAGNOSIS — R059 Cough, unspecified: Secondary | ICD-10-CM | POA: Diagnosis present

## 2020-08-04 DIAGNOSIS — U071 COVID-19: Secondary | ICD-10-CM | POA: Insufficient documentation

## 2020-08-04 LAB — BASIC METABOLIC PANEL
Anion gap: 12 (ref 5–15)
BUN: 11 mg/dL (ref 6–20)
CO2: 25 mmol/L (ref 22–32)
Calcium: 9.2 mg/dL (ref 8.9–10.3)
Chloride: 104 mmol/L (ref 98–111)
Creatinine, Ser: 0.97 mg/dL (ref 0.61–1.24)
GFR, Estimated: >60 mL/min (ref >60)
Glucose, Bld: 94 mg/dL (ref 70–99)
Potassium: 3.8 mmol/L (ref 3.5–5.1)
Sodium: 141 mmol/L (ref 135–145)

## 2020-08-04 LAB — CBC
HCT: 47.7 % (ref 39.0–52.0)
Hemoglobin: 16.2 g/dL (ref 13.0–17.0)
MCH: 28.6 pg (ref 26.0–34.0)
MCHC: 34 g/dL (ref 30.0–36.0)
MCV: 84.3 fL (ref 80.0–100.0)
Platelets: 363 10*3/uL (ref 150–400)
RBC: 5.66 MIL/uL (ref 4.22–5.81)
RDW: 12.8 % (ref 11.5–15.5)
WBC: 7.7 10*3/uL (ref 4.0–10.5)
nRBC: 0 % (ref 0.0–0.2)

## 2020-08-04 LAB — HEPATITIS PANEL, ACUTE
HCV Ab: NONREACTIVE
Hep A IgM: NONREACTIVE
Hep B C IgM: NONREACTIVE
Hepatitis B Surface Ag: NONREACTIVE

## 2020-08-04 MED ORDER — ALBUTEROL SULFATE HFA 108 (90 BASE) MCG/ACT IN AERS: 1.0000 | INHALATION_SPRAY | Freq: Four times a day (QID) | RESPIRATORY_TRACT | 1 refills | Status: AC | PRN

## 2020-08-04 NOTE — ED Triage Notes (Addendum)
Patient c/o chest pain and SOB since 06/24/21. patient states the chest pain has been constant.   Patient added that he was Covid +

## 2020-08-04 NOTE — Discharge Instructions (Signed)
Please return for any problem.  °

## 2020-08-04 NOTE — ED Provider Notes (Signed)
[facility]-EMERGENCY DEPT Provider Note   CSN: [id number] Arrival date & time: 08/04/20  1127     History Chief Complaint  Patient presents with  . Chest Pain  . Shortness of Breath  . Covid Positive    Jason Stevens is a 25 y.o. male.  25 year old male with prior medical history as detailed below presents for evaluation.  Patient reports mild cough and fatigue over the last 2 weeks.  Reports that he tested positive for COVID on January 3.  Patient denies recent fever.  Patient denies active chest discomfort at this time.  Patient was seen yesterday and did not report these complaints.   No active chest pain reported.  No current shortness of breath reported.   The history is provided by the patient and medical records.  Illness Location:  Cough, reports recent COVID positive Severity:  Mild Onset quality:  Gradual Duration:  2 weeks Timing:  Intermittent Progression:  Waxing and waning Chronicity:  New      History reviewed. No pertinent past medical history.  There are no problems to display for this patient.   History reviewed. No pertinent surgical history.     Family History  Problem Relation Age of Onset  . Cancer Mother     Social History   Tobacco Use  . Smoking status: Never Smoker  . Smokeless tobacco: Never Used  Vaping Use  . Vaping Use: Never used  Substance Use Topics  . Alcohol use: No  . Drug use: Yes    Types: Marijuana    Comment: every now and then    Home Medications Prior to Admission medications   Medication Sig Start Date End Date Taking? Authorizing Provider  albuterol (VENTOLIN HFA) 108 (90 Base) MCG/ACT inhaler Inhale 1-2 puffs into the lungs every 6 (six) hours as needed for wheezing or shortness of breath. 08/04/20  Yes Wynetta Fines, MD  DM-Doxylamine-Acetaminophen (NYQUIL COLD & FLU PO) Take 30 mLs by mouth at bedtime as needed (sleep).    [provider]  doxycycline  (VIBRAMYCIN) 100 MG capsule Take 1 capsule (100 mg total) by mouth 2 (two) times daily. 08/03/20   Horton, Clabe Seal, DO  ibuprofen (ADVIL) 600 MG tablet Take 1 tablet (600 mg total) by mouth every 6 (six) hours as needed for up to 30 doses for mild pain or moderate pain. Take with food 07/01/20   Terald Sleeper, MD  omeprazole (PRILOSEC) 20 MG capsule Take 1 capsule (20 mg total) by mouth daily for 14 days. 07/14/17 07/28/17  Shaune Pollack, MD  ondansetron (ZOFRAN ODT) 4 MG disintegrating tablet Take 1 tablet (4 mg total) by mouth every 8 (eight) hours as needed for nausea or vomiting. 07/14/17   Shaune Pollack, MD  ondansetron (ZOFRAN) 4 MG tablet Take 1 tablet (4 mg total) by mouth every 6 (six) hours. 12/02/17   Hedges, Tinnie Gens, PA-C  Pseudoephedrine-APAP-DM (DAYQUIL MULTI-SYMPTOM COLD/FLU PO) Take 30 mLs by mouth daily as needed (pain).    [provider]    Allergies    Patient has no known allergies.  Review of Systems   Review of Systems  All other systems reviewed and are negative.   Physical Exam Updated Vital Signs BP (!) 147/94   Pulse 69   Temp 98.7 F (37.1 C) (Oral)   Resp 18   Ht 5\' 8"  (1.727 m)   Wt 90.7 kg   SpO2 98%   BMI 30.41 kg/m   Physical Exam Vitals  and nursing note reviewed.  Constitutional:      General: He is not in acute distress.    Appearance: He is well-developed and well-nourished.  HENT:     Head: Normocephalic and atraumatic.     Mouth/Throat:     Mouth: Oropharynx is clear and moist.  Eyes:     Extraocular Movements: EOM normal.     Conjunctiva/sclera: Conjunctivae normal.     Pupils: Pupils are equal, round, and reactive to light.  Cardiovascular:     Rate and Rhythm: Normal rate and regular rhythm.     Heart sounds: Normal heart sounds.  Pulmonary:     Effort: Pulmonary effort is normal. No respiratory distress.     Breath sounds: Normal breath sounds.  Abdominal:     General: There is no distension.     Palpations:  Abdomen is soft.     Tenderness: There is no abdominal tenderness.  Musculoskeletal:        General: No deformity or edema. Normal range of motion.     Cervical back: Normal range of motion and neck supple.  Skin:    General: Skin is warm and dry.  Neurological:     Mental Status: He is alert and oriented to person, place, and time.  Psychiatric:        Mood and Affect: Mood and affect normal.     ED Results / Procedures / Treatments   Labs (all labs ordered are listed, but only abnormal results are displayed) Labs Reviewed  BASIC METABOLIC PANEL  CBC    EKG EKG Interpretation  Date/Time:  Thursday August 04 2020 16:37:08 EST Ventricular Rate:  76 PR Interval:    QRS Duration: 88 QT Interval:  336 QTC Calculation: 378 R Axis:   77 Text Interpretation: Sinus rhythm Atrial premature complex Borderline repolarization abnormality Confirmed by Kristine Royal [id number]) on 08/04/2020 4:42:06 PM   Radiology No results found.  Procedures Procedures (including critical care time)  Medications Ordered in ED Medications - No data to display  ED Course  I have reviewed the triage vital signs and the nursing notes.  Pertinent labs & imaging results that were available during my care of the patient were reviewed by me and considered in my medical decision making (see chart for details).    MDM Rules/Calculators/A&P                          MDM  Screen complete  Jason Stevens was evaluated in Emergency Department on 08/04/2020 for the symptoms described in the history of present illness. He was evaluated in the context of the global COVID-19 pandemic, which necessitated consideration that the patient might be at risk for infection with the SARS-CoV-2 virus that causes COVID-19. Institutional protocols and algorithms that pertain to the evaluation of patients at risk for COVID-19 are in a state of rapid change based on information released by regulatory bodies including  the CDC and federal and state organizations. These policies and algorithms were followed during the patient's care in the ED.  Patient is complaining of mild cough, fatigue, and associated chest discomfort.  Patient with reported positive COVID test on January 3.  Patient is currently without evidence of significant illness.  His pulse ox is normal on room air.  His EKG is without significant acute findings.  Screening labs otherwise are within normal limits.  Patient appears to be comfortable during my exam.  Patient's symptoms are most likely related  to fairly mild COVID infection.  Patient is appropriate for discharge.  Patient is at least 12 days out from suspected COVID infection (reported positive test on 1/3).  He understands need for close follow-up.  Strict return precautions given and understood.  He will trial albuterol at home for possible element of bronchospasm.   Final Clinical Impression(s) / ED Diagnoses Final diagnoses:  Cough    Rx / DC Orders ED Discharge Orders         Ordered    albuterol (VENTOLIN HFA) 108 (90 Base) MCG/ACT inhaler  Every 6 hours PRN        08/04/20 1709           Wynetta Fines, MD 08/04/20 1720

## 2020-09-10 ENCOUNTER — Emergency Department (HOSPITAL_COMMUNITY)
Admission: EM | Admit: 2020-09-10 | Discharge: 2020-09-10 | Disposition: A | Discharge status: Home / Self Care | Payer: Self-pay | Attending: Emergency Medicine | Admitting: Emergency Medicine

## 2020-09-10 ENCOUNTER — Other Ambulatory Visit: Payer: Self-pay

## 2020-09-10 ENCOUNTER — Encounter (HOSPITAL_COMMUNITY): Payer: Self-pay | Admitting: Emergency Medicine

## 2020-09-10 DIAGNOSIS — R300 Dysuria: Secondary | ICD-10-CM | POA: Insufficient documentation

## 2020-09-10 DIAGNOSIS — N4889 Other specified disorders of penis: Secondary | ICD-10-CM | POA: Insufficient documentation

## 2020-09-10 LAB — URINALYSIS, COMPLETE (UACMP) WITH MICROSCOPIC
Bacteria, UA: NONE SEEN
Bilirubin Urine: NEGATIVE
Glucose, UA: NEGATIVE mg/dL
Hgb urine dipstick: NEGATIVE
Ketones, ur: NEGATIVE mg/dL
Leukocytes,Ua: NEGATIVE
Nitrite: NEGATIVE
Protein, ur: NEGATIVE mg/dL
Specific Gravity, Urine: 1.021 (ref 1.005–1.030)
pH: 5 (ref 5.0–8.0)

## 2020-09-10 MED ORDER — DOXYCYCLINE HYCLATE 100 MG PO TABS
100.0000 mg | ORAL_TABLET | Freq: Once | ORAL | Status: AC
  Administered 2020-09-10: 100 mg via ORAL
  Filled 2020-09-10: qty 1

## 2020-09-10 MED ORDER — CEFTRIAXONE SODIUM 1 G IJ SOLR
500.0000 mg | Freq: Once | INTRAMUSCULAR | Status: AC
  Administered 2020-09-10: 500 mg via INTRAMUSCULAR
  Filled 2020-09-10: qty 10

## 2020-09-10 MED ORDER — STERILE WATER FOR INJECTION IJ SOLN
INTRAMUSCULAR | Status: AC
  Administered 2020-09-10: 10 mL
  Filled 2020-09-10: qty 10

## 2020-09-10 MED ORDER — DOXYCYCLINE HYCLATE 100 MG PO CAPS: 100.0000 mg | ORAL_CAPSULE | Freq: Two times a day (BID) | ORAL | 0 refills | Status: AC

## 2020-09-10 NOTE — Discharge Instructions (Addendum)
You were seen in the ED today for your discomfort when urinating. You were tested for gonorrhea and chlamydia today, but those test results take the 3 days to come back.  You may follow your test results in your MyChart app.  After discussion, you opted to proceed with presumptive treatment for gonorrhea and chlamydia while in the emergency department today.  You were administered an injection to treat you for gonorrhea and you should take the antibiotics as prescribed to daily for the next 10 days at home to cover you for chlamydia.  It is important that you take the entire course of antibiotics as prescribed for the entire duration.  Should your test results come back positive you should notify all of your partners.  Additionally it is important that you do not have any sexual intercourse until you have completed your antibiotic therapy and have been tested for cure your infections.  You may follow-up with health department for any further sexually transmitted infection testing.

## 2020-09-10 NOTE — ED Triage Notes (Signed)
Patient reports pain with urination x2 days. Requesting STD testing after recently having unprotected sex.

## 2020-09-10 NOTE — ED Provider Notes (Signed)
[facility]-EMERGENCY DEPT Provider Note   CSN: [id number] Arrival date & time: 09/10/20  1615     History Chief Complaint  Patient presents with  . Dysuria    Jason Stevens is a 25 y.o. male who presents with 5 days of dysuria and pain at the tip of his penis.  He states that he has had on protected sex with his girlfriend recently, and is concerned he may have been exposed to an STD.  Patient was seen in January for similar concern and was treated presumptively for gonorrhea and chlamydia.  He states that his symptoms resolved after antibiotic therapy and states he completed 10 days of doxycycline at home.  He states the symptoms have are new and have only been present for the last 5 days.  He denies any rashes, skin changes, ulcers, or pain in his testicles.  He denies any abdominal pain, nausea, vomiting, diarrhea.  Denies hematuria, urinary frequency or urgency.  Endorses sexual activity with women only, endorses history of chlamydia infection in the past for which he was treated but did not undergo test of cure.  I personally reviewed this patient's medical records.  He does not carry medical diagnoses and is not on any medications every day.  HPI     History reviewed. No pertinent past medical history.  There are no problems to display for this patient.   History reviewed. No pertinent surgical history.     Family History  Problem Relation Age of Onset  . Cancer Mother     Social History   Tobacco Use  . Smoking status: Never Smoker  . Smokeless tobacco: Never Used  Vaping Use  . Vaping Use: Never used  Substance Use Topics  . Alcohol use: No  . Drug use: Yes    Types: Marijuana    Comment: every now and then    Home Medications Prior to Admission medications   Medication Sig Start Date End Date Taking? Authorizing Provider  doxycycline (VIBRAMYCIN) 100 MG capsule Take 1 capsule (100 mg total) by mouth 2 (two) times daily for  10 days. 09/10/20 09/20/20 Yes Eliu Batch R, PA-C  albuterol (VENTOLIN HFA) 108 (90 Base) MCG/ACT inhaler Inhale 1-2 puffs into the lungs every 6 (six) hours as needed for wheezing or shortness of breath. 08/04/20   Wynetta Fines, MD  DM-Doxylamine-Acetaminophen (NYQUIL COLD & FLU PO) Take 30 mLs by mouth at bedtime as needed (sleep).    [provider]  ibuprofen (ADVIL) 600 MG tablet Take 1 tablet (600 mg total) by mouth every 6 (six) hours as needed for up to 30 doses for mild pain or moderate pain. Take with food 07/01/20   Terald Sleeper, MD  omeprazole (PRILOSEC) 20 MG capsule Take 1 capsule (20 mg total) by mouth daily for 14 days. 07/14/17 07/28/17  Shaune Pollack, MD  ondansetron (ZOFRAN ODT) 4 MG disintegrating tablet Take 1 tablet (4 mg total) by mouth every 8 (eight) hours as needed for nausea or vomiting. 07/14/17   Shaune Pollack, MD  ondansetron (ZOFRAN) 4 MG tablet Take 1 tablet (4 mg total) by mouth every 6 (six) hours. 12/02/17   Hedges, Tinnie Gens, PA-C  Pseudoephedrine-APAP-DM (DAYQUIL MULTI-SYMPTOM COLD/FLU PO) Take 30 mLs by mouth daily as needed (pain).    [provider]    Allergies    Patient has no known allergies.  Review of Systems   Review of Systems  Constitutional: Negative.   HENT: Negative.   Eyes:  Negative.   Respiratory: Negative.   Cardiovascular: Negative.   Gastrointestinal: Negative.   Endocrine: Negative.   Genitourinary: Positive for dysuria and penile pain. Negative for decreased urine volume, difficulty urinating, flank pain, frequency, genital sores, hematuria, penile discharge, penile swelling, scrotal swelling, testicular pain and urgency.  Musculoskeletal: Negative.   Neurological: Negative.   Hematological: Negative.   Psychiatric/Behavioral: Negative.     Physical Exam Updated Vital Signs BP 110/81 (BP Location: Left Arm)   Pulse 80   Temp 98.1 F (36.7 C) (Oral)   Resp 17   SpO2 96%   Physical  Exam Vitals and nursing note reviewed. Exam conducted with a chaperone present.  HENT:     Head: Normocephalic and atraumatic.     Nose: Nose normal.     Mouth/Throat:     Mouth: Mucous membranes are moist.     Pharynx: No oropharyngeal exudate or posterior oropharyngeal erythema.  Eyes:     General:        Right eye: No discharge.        Left eye: No discharge.     Extraocular Movements: Extraocular movements intact.     Conjunctiva/sclera: Conjunctivae normal.     Pupils: Pupils are equal, round, and reactive to light.  Cardiovascular:     Rate and Rhythm: Normal rate and regular rhythm.     Pulses: Normal pulses.     Heart sounds: Normal heart sounds. No murmur heard.   Pulmonary:     Effort: Pulmonary effort is normal. No respiratory distress.     Breath sounds: Normal breath sounds. No wheezing or rales.  Abdominal:     General: Bowel sounds are normal. There is no distension.     Palpations: Abdomen is soft.     Tenderness: There is no abdominal tenderness. There is no guarding or rebound.  Genitourinary:    Penis: Normal and uncircumcised.      Testes: Normal.     Epididymis:     Right: Normal.     Left: Normal.  Musculoskeletal:        General: No deformity.     Cervical back: Neck supple.     Right lower leg: No edema.     Left lower leg: No edema.  Skin:    General: Skin is warm and dry.     Capillary Refill: Capillary refill takes less than 2 seconds.  Neurological:     Mental Status: He is alert and oriented to person, place, and time. Mental status is at baseline.  Psychiatric:        Mood and Affect: Mood normal.     ED Results / Procedures / Treatments   Labs (all labs ordered are listed, but only abnormal results are displayed) Labs Reviewed  URINALYSIS, COMPLETE (UACMP) WITH MICROSCOPIC  GC/CHLAMYDIA PROBE AMP ([place]) NOT AT [facility]    EKG None  Radiology No results found.  Procedures Procedures   Medications Ordered in  ED Medications  cefTRIAXone (ROCEPHIN) injection 500 mg (500 mg Intramuscular Given 09/10/20 2105)  doxycycline (VIBRA-TABS) tablet 100 mg (100 mg Oral Given 09/10/20 2107)  sterile water (preservative free) injection (10 mLs  Given 09/10/20 2107)    ED Course  I have reviewed the triage vital signs and the nursing notes.  Pertinent labs & imaging results that were available during my care of the patient were reviewed by me and considered in my medical decision making (see chart for details).    MDM Rules/Calculators/A&P  25 year old male who presents with concern for 5 days of dysuria after unprotected sexual encounter with his girlfriend.  Vital signs are normal on intake.  Cardiopulmonary exam is normal, abdominal exam is benign, GU exam unremarkable.  Will proceed with UA and urine GC/chlamydia.  Patient was recently tested for HIV and hepatitis, declining retest at this time, declining syphilis testing.  Shared decision making with this patient regarding presumptive treatment for GC/chlamydia versus waiting for test results.  Patient voiced understanding of his treatment options and clearly expressed wishes to proceed with presumptive treatment at this time.  UA negative for infection.  GC/committee pending.  Patient has received his presumptive treatment for GC/chlamydia.  Reassuring physical exam, vital signs, and urine testing, no further work-up is warranted in ED at this time.  Patient to follow-up on his GC/chlamydia results in his MyChart app.  Will discharge with Doxy prescription for the next 10 days.  Ezel voiced understanding with medical evaluation and treatment plan.  Each of his questions was answered to his expressed satisfaction.  Return precautions reviewed.  Patient is well-appearing, stable, and appropriate for discharge at this time.    This chart was dictated using voice recognition software, Dragon. Despite the best efforts of this provider  to proofread and correct errors, errors may still occur which can change documentation meaning.  Final Clinical Impression(s) / ED Diagnoses Final diagnoses:  Dysuria    Rx / DC Orders ED Discharge Orders         Ordered    doxycycline (VIBRAMYCIN) 100 MG capsule  2 times daily        09/10/20 2139           Twanda Stakes, Idelia Salm 09/10/20 2219    Lorre Nick, MD 09/13/20 1016

## 2020-09-12 IMAGING — CT CT ABD-PELV W/ CM
2 of 4 series · 16 of 46 positions shown, 18 images · IV contrast (APPLIED)
Comparison: CT abdomen pelvis December 02, 2017

CLINICAL DATA: Patient with bilateral flank pain for 1 week.

EXAM:
CT ABDOMEN AND PELVIS WITH CONTRAST
TECHNIQUE: Multidetector CT imaging of the abdomen and pelvis was performed
using the standard protocol following bolus administration of
intravenous contrast.
CONTRAST:  100mL OMNIPAQUE IOHEXOL 300 MG/ML  SOLN

[Series 2: axial st · axial · 0.98mm/px · z∈[-498,-3]mm · 13 of 109 slices shown, 15 images]
[im 5/109  soft-tissue]
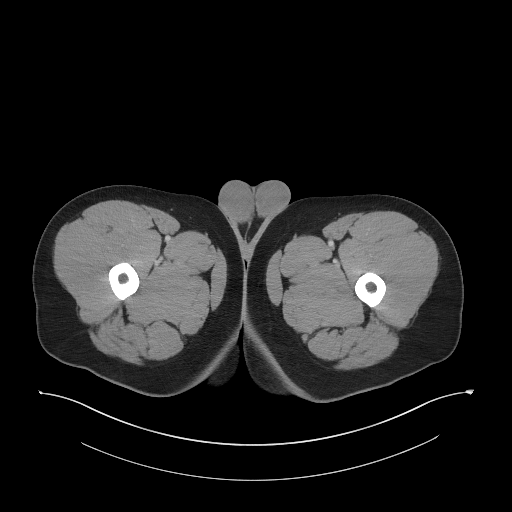
[im 5/109  bone]
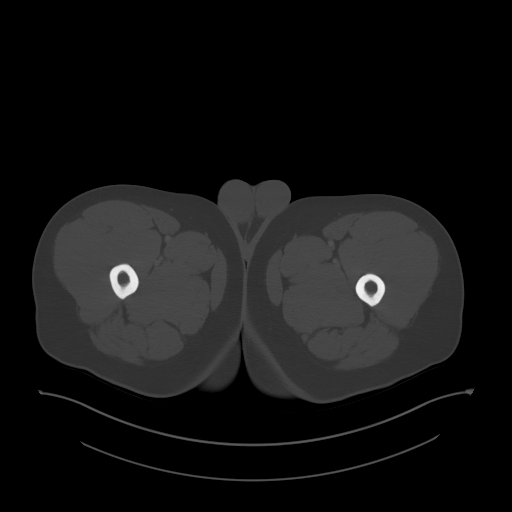
[im 13/109  soft-tissue]
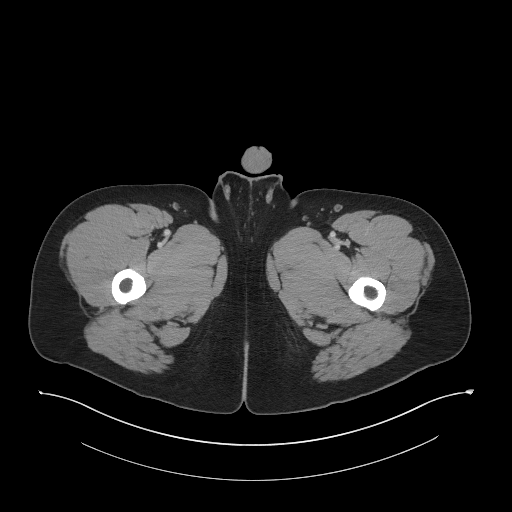
[im 21/109  soft-tissue]
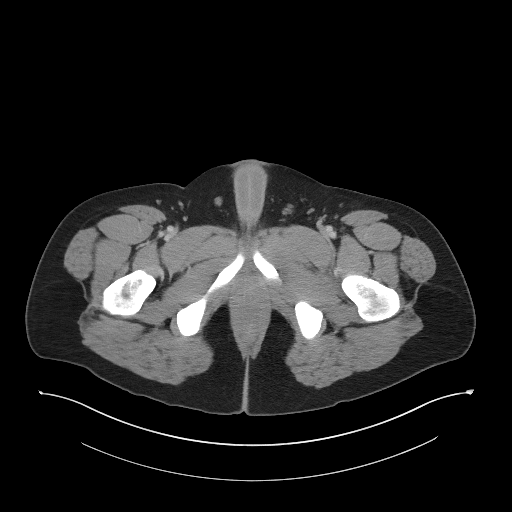
[im 30/109  soft-tissue]
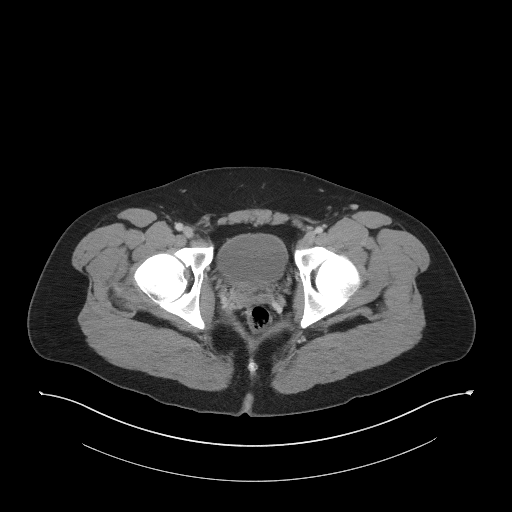
[im 38/109  soft-tissue]
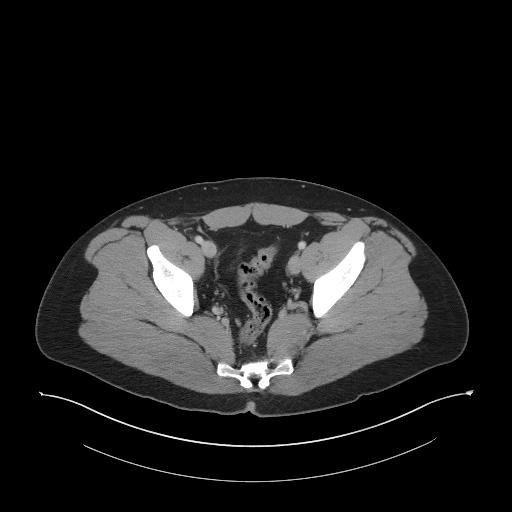
[im 46/109  soft-tissue]
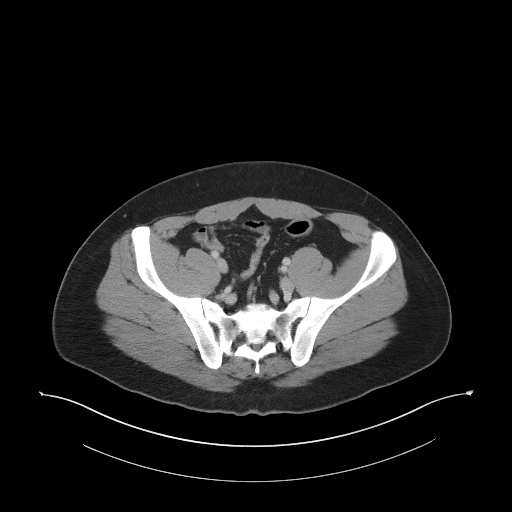
[im 55/109  soft-tissue]
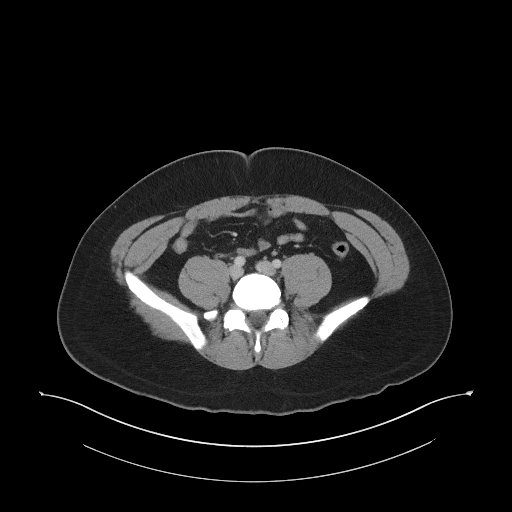
[im 63/109  soft-tissue]
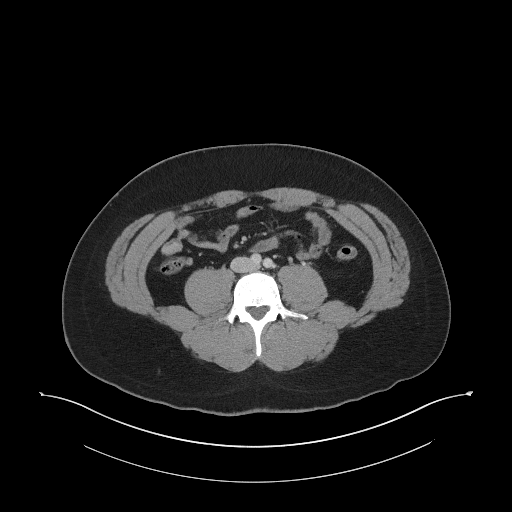
[im 71/109  soft-tissue]
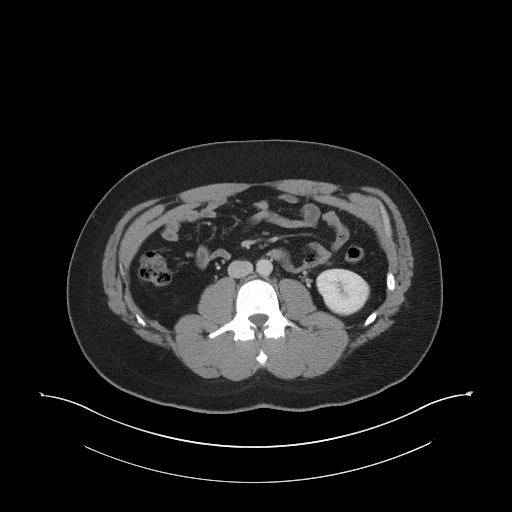
[im 71/109  bone]
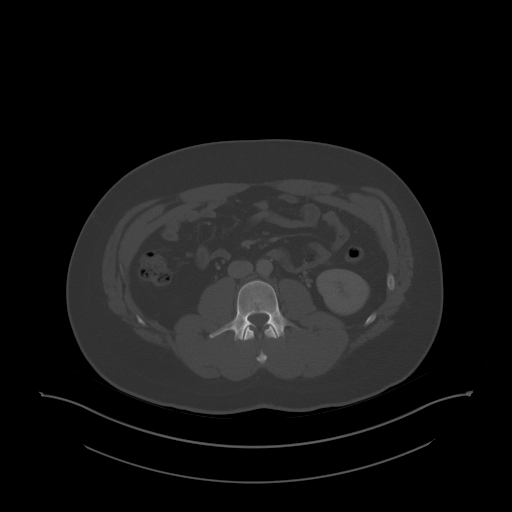
[im 79/109  soft-tissue]
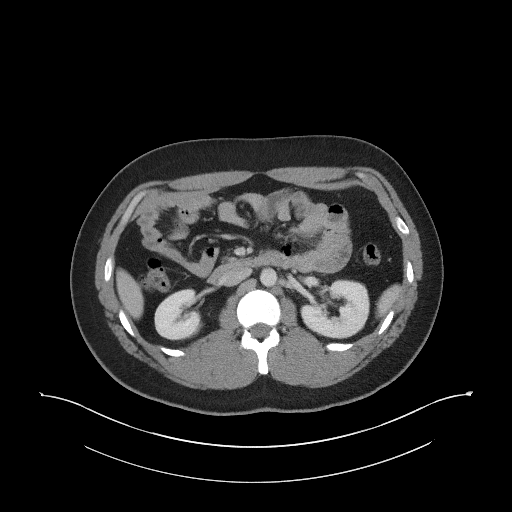
[im 88/109  soft-tissue]
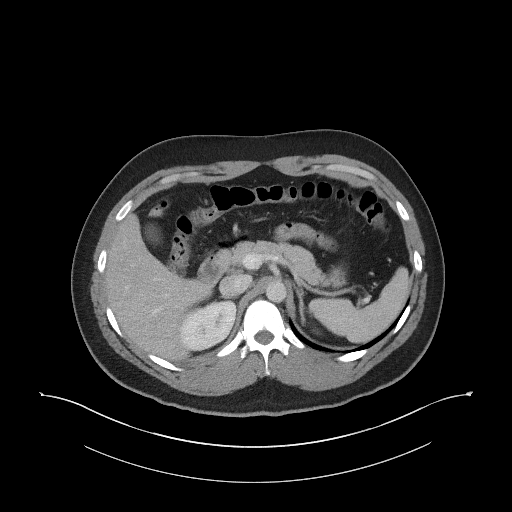
[im 96/109  soft-tissue]
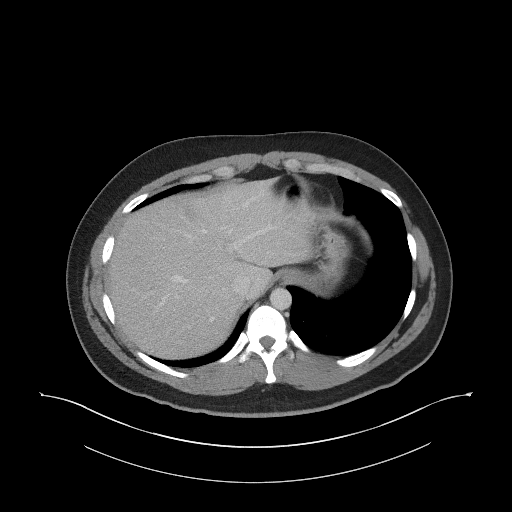
[im 104/109  soft-tissue]
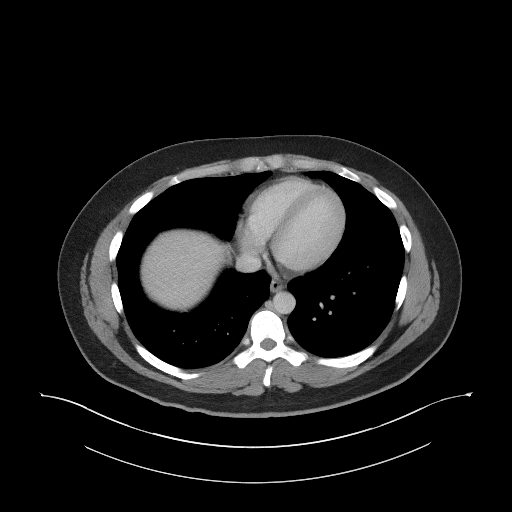

[Series 5: coronal st · coronal · 0.84mm/px · 3 of 92 slices shown]
[im 31/92  soft-tissue]
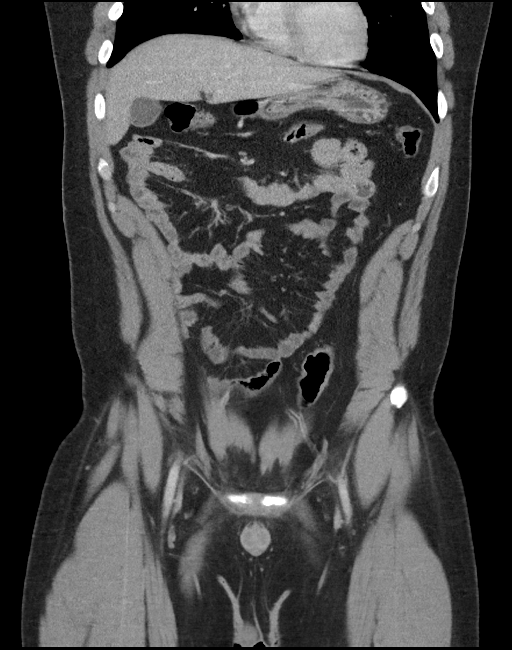
[im 41/92  soft-tissue]
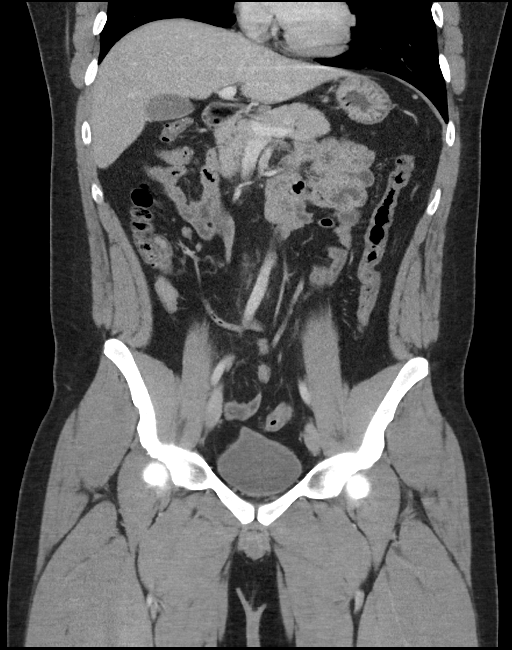
[im 51/92  soft-tissue]
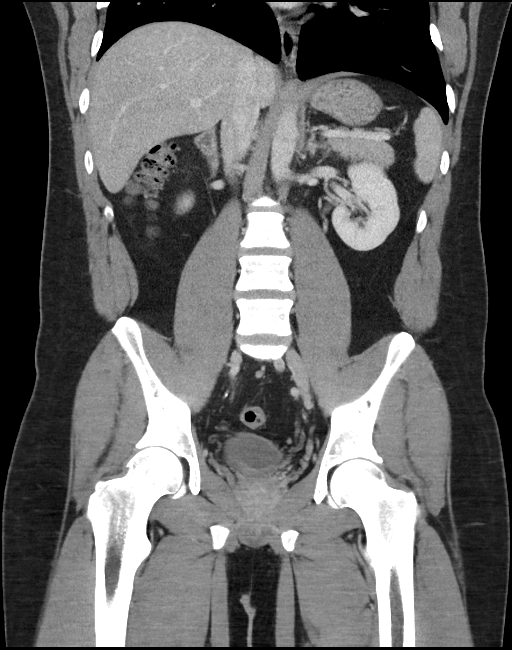

[16 of 46 positions shown; findings below may reference images not displayed]

FINDINGS: Lower chest: Normal heart size. Lung bases are clear. No pleural
effusion.

Hepatobiliary: Liver is normal in size and contour. No focal hepatic
lesions identified. Gallbladder is unremarkable. No intrahepatic or
extrahepatic biliary ductal dilatation.

Pancreas: Unremarkable

Spleen: Unremarkable

Adrenals/Urinary Tract: Normal adrenal glands. Kidneys enhance
symmetrically with contrast. No hydronephrosis. Urinary bladder is
unremarkable.

Stomach/Bowel: Normal morphology of the stomach. No evidence for
small bowel obstruction. No free fluid or free intraperitoneal air.
Normal appendix.

Vascular/Lymphatic: Normal caliber abdominal aorta. No
retroperitoneal lymphadenopathy.

Reproductive: Heterogeneous prostate.

Other: None.

Musculoskeletal: No aggressive or acute appearing osseous lesions.
IMPRESSION: No acute process within the abdomen or pelvis.

## 2021-02-06 ENCOUNTER — Encounter (HOSPITAL_COMMUNITY): Payer: Self-pay

## 2021-02-06 ENCOUNTER — Other Ambulatory Visit: Payer: Self-pay

## 2021-02-06 ENCOUNTER — Emergency Department (HOSPITAL_COMMUNITY): Payer: Self-pay

## 2021-02-06 ENCOUNTER — Emergency Department (HOSPITAL_COMMUNITY)
Admission: EM | Admit: 2021-02-06 | Discharge: 2021-02-06 | Disposition: A | Discharge status: Home / Self Care | Payer: Self-pay | Attending: Emergency Medicine | Admitting: Emergency Medicine

## 2021-02-06 DIAGNOSIS — H538 Other visual disturbances: Secondary | ICD-10-CM | POA: Insufficient documentation

## 2021-02-06 DIAGNOSIS — R42 Dizziness and giddiness: Secondary | ICD-10-CM | POA: Insufficient documentation

## 2021-02-06 DIAGNOSIS — R55 Syncope and collapse: Secondary | ICD-10-CM | POA: Insufficient documentation

## 2021-02-06 DIAGNOSIS — S40021A Contusion of right upper arm, initial encounter: Secondary | ICD-10-CM | POA: Insufficient documentation

## 2021-02-06 DIAGNOSIS — R519 Headache, unspecified: Secondary | ICD-10-CM | POA: Insufficient documentation

## 2021-02-06 DIAGNOSIS — W228XXA Striking against or struck by other objects, initial encounter: Secondary | ICD-10-CM | POA: Insufficient documentation

## 2021-02-06 LAB — COMPREHENSIVE METABOLIC PANEL
ALT: 32 U/L (ref 0–44)
AST: 18 U/L (ref 15–41)
Albumin: 4.6 g/dL (ref 3.5–5.0)
Alkaline Phosphatase: 73 U/L (ref 38–126)
Anion gap: 9 (ref 5–15)
BUN: 14 mg/dL (ref 6–20)
CO2: 29 mmol/L (ref 22–32)
Calcium: 9.6 mg/dL (ref 8.9–10.3)
Chloride: 101 mmol/L (ref 98–111)
Creatinine, Ser: 1.14 mg/dL (ref 0.61–1.24)
GFR, Estimated: >60 mL/min (ref >60)
Glucose, Bld: 88 mg/dL (ref 70–99)
Potassium: 3.6 mmol/L (ref 3.5–5.1)
Sodium: 139 mmol/L (ref 135–145)
Total Bilirubin: 0.9 mg/dL (ref 0.3–1.2)
Total Protein: 8.1 g/dL (ref 6.5–8.1)

## 2021-02-06 LAB — CBC
HCT: 47 % (ref 39.0–52.0)
Hemoglobin: 16.2 g/dL (ref 13.0–17.0)
MCH: 29.2 pg (ref 26.0–34.0)
MCHC: 34.5 g/dL (ref 30.0–36.0)
MCV: 84.7 fL (ref 80.0–100.0)
Platelets: 333 10*3/uL (ref 150–400)
RBC: 5.55 MIL/uL (ref 4.22–5.81)
RDW: 13 % (ref 11.5–15.5)
WBC: 8.3 10*3/uL (ref 4.0–10.5)
nRBC: 0 % (ref 0.0–0.2)

## 2021-02-06 LAB — TROPONIN I (HIGH SENSITIVITY): Troponin I (High Sensitivity): 3 ng/L (ref <18)

## 2021-02-06 LAB — MAGNESIUM: Magnesium: 2.4 mg/dL (ref 1.7–2.4)

## 2021-02-06 LAB — CBG MONITORING, ED: Glucose-Capillary: 120 mg/dL — ABNORMAL HIGH (ref 70–99)

## 2021-02-06 NOTE — ED Provider Notes (Signed)
[facility] Provider Triage Evaluation Note  Jason Stevens , a 25 y.o. male  was evaluated in triage.  Pt complains of episode of syncope today.  States he was at work when he suddenly felt lightheaded, had gradually blurry vision, and then lost consciousness.  He states he woke up on the floor and believes he hit his chin on the edge of the sink on his way down.  Endorses very mild headache at this time as well as persistent symptom of lightheadedness.  No history of syncope in the past..  Review of Systems  Positive: Syncope, lightheadedness, headache Negative: Chest pain, shortness of breath, palpitations, fevers, chills  Physical Exam  BP 120/73 (BP Location: Right Arm)   Pulse 78   Temp 98.2 F (36.8 C) (Oral)   Resp 10   Ht 5\' 8"  (1.727 m)   Wt 101.6 kg   SpO2 98%   BMI 34.06 kg/m  Gen:   Awake, no distress   Resp:  Normal effort  MSK:   Moves extremities without difficulty  Other:  Tenderness palpation over the angle of the mandible on the right with very minimal bruising beginning.  No step-offs or hematomas of the skull.  RRR no M/R/G.  No focal deficit on neuro exam.  Medical Decision Making  Medically screening exam initiated at 4:23 PM.  Appropriate orders placed.  Jason Stevens was informed that the remainder of the evaluation will be completed by another provider, this initial triage assessment does not replace that evaluation, and the importance of remaining in the ED until their evaluation is complete.  This chart was dictated using voice recognition software, Dragon. Despite the best efforts of this provider to proofread and correct errors, errors may still occur which can change documentation meaning.    Renford Dills, PA-C 02/06/21 1625    [place], 02/08/21, MD 02/06/21 1718

## 2021-02-06 NOTE — Discharge Instructions (Addendum)
Your work-up today was quite reassuring. Without an x-ray I cannot definitively say do not have a fracture of your right upper arm however I have a very low suspicion for this and given that you would prefer not to have x-ray I think this is very reasonable.  Rest ice and elevate your arm. Please use Tylenol or ibuprofen for pain.  You may use 600 mg ibuprofen every 6 hours or 1000 mg of Tylenol every 6 hours.  You may choose to alternate between the 2.  This would be most effective.  Not to exceed 4 g of Tylenol within 24 hours.  Not to exceed 3200 mg ibuprofen 24 hours.     Please return immediately to the ER if she experience any new or concerning symptoms such as passing out or any chest pain shortness of breath

## 2021-02-06 NOTE — ED Provider Notes (Signed)
[facility]-EMERGENCY DEPT Provider Note   CSN: [id number] Arrival date & time: 02/06/21  1519     History Chief Complaint  Patient presents with   Loss of Consciousness    Jason Stevens is a 25 y.o. male.  HPI Patient is a 25 year old male with no pertinent past medical history presenting today after a single syncopal episode that occurred today.  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  Says that he hit the side of his face on the edge of the sink on his way down he states that he has a mild headache but states he did not hit his actual head just the side of his jaw.  He states that he was initially having some lightheadedness which is what prompted him to come to the ER however he states he does not feel lightheaded at all currently.  Denies any chest pain shortness breath lightheadedness currently, nausea, vomiting or neck pain.  No other associate symptoms.  No aggravating mitigating factors.     History reviewed. No pertinent past medical history.  There are no problems to display for this patient.   History reviewed. No pertinent surgical history.     Family History  Problem Relation Age of Onset   Cancer Mother     Social History   Tobacco Use   Smoking status: Never   Smokeless tobacco: Never  Vaping Use   Vaping Use: Never used  Substance Use Topics   Alcohol use: No   Drug use: Yes    Types: Marijuana    Comment: every now and then    Home Medications Prior to Admission medications   Medication Sig Start Date End Date Taking? Authorizing Provider  albuterol (VENTOLIN HFA) 108 (90 Base) MCG/ACT inhaler Inhale 1-2 puffs into the lungs every 6 (six) hours as needed for wheezing or shortness of breath. 08/04/20   Wynetta Fines, MD  DM-Doxylamine-Acetaminophen  (NYQUIL COLD & FLU PO) Take 30 mLs by mouth at bedtime as needed (sleep).    [provider]  ibuprofen (ADVIL) 600 MG tablet Take 1 tablet (600 mg total) by mouth every 6 (six) hours as needed for up to 30 doses for mild pain or moderate pain. Take with food 07/01/20   Terald Sleeper, MD  omeprazole (PRILOSEC) 20 MG capsule Take 1 capsule (20 mg total) by mouth daily for 14 days. 07/14/17 07/28/17  Shaune Pollack, MD  ondansetron (ZOFRAN ODT) 4 MG disintegrating tablet Take 1 tablet (4 mg total) by mouth every 8 (eight) hours as needed for nausea or vomiting. 07/14/17   Shaune Pollack, MD  ondansetron (ZOFRAN) 4 MG tablet Take 1 tablet (4 mg total) by mouth every 6 (six) hours. 12/02/17   Hedges, Tinnie Gens, PA-C  Pseudoephedrine-APAP-DM (DAYQUIL MULTI-SYMPTOM COLD/FLU PO) Take 30 mLs by mouth daily as needed (pain).    [provider]    Allergies    Patient has no known allergies.  Review of Systems   Review of Systems  Constitutional:  Negative for chills and fever.  HENT:  Negative for congestion.        Some right jaw pain  Eyes:  Negative for pain.  Respiratory:  Negative for cough and shortness of breath.   Cardiovascular:  Negative for chest pain and leg swelling.  Gastrointestinal:  Negative for abdominal pain and vomiting.  Genitourinary:  Negative for dysuria.  Musculoskeletal:  Negative for myalgias.       Right arm bruise  Skin:  Negative for rash.  Neurological:  Positive for syncope. Negative for dizziness.   Physical Exam Updated Vital Signs BP 134/87 (BP Location: Left Arm)   Pulse 82   Temp 98 F (36.7 C) (Oral)   Resp 18   Ht 5\' 8"  (1.727 m)   Wt 101.6 kg   SpO2 96%   BMI 34.06 kg/m   Physical Exam Vitals and nursing note reviewed.  Constitutional:      General: He is not in acute distress. HENT:     Head: Normocephalic and atraumatic.     Nose: Nose normal.  Eyes:     General: No scleral icterus. Cardiovascular:     Rate and  Rhythm: Normal rate and regular rhythm.     Pulses: Normal pulses.     Heart sounds: Normal heart sounds.  Pulmonary:     Effort: Pulmonary effort is normal. No respiratory distress.     Breath sounds: No wheezing.  Abdominal:     Palpations: Abdomen is soft.     Tenderness: There is no abdominal tenderness.  Musculoskeletal:     Cervical back: Normal range of motion.     Right lower leg: No edema.     Left lower leg: No edema.     Comments: Small linear bruise to the right upper arm.  No significant tenderness palpation here.  Full range of motion of upper and lower extremities.  No other palpable tenderness of the C, T, L-spine or any other areas of bone.  Skin:    General: Skin is warm and dry.     Capillary Refill: Capillary refill takes less than 2 seconds.  Neurological:     Mental Status: He is alert. Mental status is at baseline.  Psychiatric:        Mood and Affect: Mood normal.        Behavior: Behavior normal.    ED Results / Procedures / Treatments   Labs (all labs ordered are listed, but only abnormal results are displayed) Labs Reviewed  CBG MONITORING, ED - Abnormal; Notable for the following components:      Result Value   Glucose-Capillary 120 (*)    All other components within normal limits  CBC  COMPREHENSIVE METABOLIC PANEL  MAGNESIUM  CBG MONITORING, ED  TROPONIN I (HIGH SENSITIVITY)    EKG None  Radiology DG Chest 2 View  Result Date: 02/06/2021 CLINICAL DATA:  Syncope EXAM: CHEST - 2 VIEW COMPARISON:  07/14/2017 FINDINGS: The heart size and mediastinal contours are within normal limits. Both lungs are clear. The visualized skeletal structures are unremarkable. IMPRESSION: No active cardiopulmonary disease. Electronically Signed   By: 07/16/2017 D.O.   On: 02/06/2021 17:17    Procedures Procedures   Medications Ordered in ED Medications - No data to display  ED Course  I have reviewed the triage vital signs and the nursing  notes.  Pertinent labs & imaging results that were available during my care of the patient were reviewed by me and considered in my medical decision making (see chart for details).    MDM Rules/Calculators/A&P                          Patient is a healthy 25 year old male with no pertinent past medical history  presented today after syncopal episode that occurred just prior to arrival in the ER.  He states he really has no pain currently. His prodromal symptoms included lightheadedness, vision fading out and syncope followed by complete resolution of his lightheadedness.  He states he has had not had any fevers chills cough congestion.  States he feels that he is eating and drinking well.  My examination patient is neurologically intact.  Has no cranial or cervical spine tenderness.  Heart and lungs sound normal. He does have a small amount of bruising to his right upper arm however is not interested in having any x-rays done today.  Lab work obtained in triage was reassuring. Troponin x1 within normal limits.  Magnesium within normal limits.  CMP unremarkable CBC without leukocytosis or anemia.  Chest x-ray without any mediastinal widening or abnormality.  Agree of radiology read.  EKG normal sinus rhythm with no significant axis deviations.  Very small inferior Q waves.  No other acute abnormality.  No evidence of ST-T wave abnormalities of note normal intervals no evidence of Wolff-Parkinson-White, long QT, hokum or other arrhythmia  Patient has been here in the ER for nearly 4 hours.  He states he would like to go home.  He is ambulatory at time of discharge.  I personally ambulated him around the room he did not experience any lightheadedness or dizziness.  Vital signs within normal limits.  Will discharge home at this time.  Patient understanding of strict return precautions.  Final Clinical Impression(s) / ED Diagnoses Final diagnoses:  Syncope and collapse    Rx / DC Orders ED  Discharge Orders     None        Gailen Shelter, [place] 02/06/21 2101    Tegeler, Canary Brim, MD 02/06/21 [phone]

## 2021-02-06 NOTE — ED Triage Notes (Signed)
Patient states he was filling up a mop bucket and felt dizzy. Patient states he had LOC and it was unwitnessed. Patient states as soon as he hit the floor he regained consciousness.  Patient states he hit the right side of his jaw line under the ear, right upper arm , and the side of his right knee on a pipes.

## 2021-02-07 ENCOUNTER — Encounter (HOSPITAL_COMMUNITY): Payer: Self-pay | Admitting: *Deleted

## 2021-02-07 ENCOUNTER — Emergency Department (HOSPITAL_COMMUNITY)
Admission: EM | Admit: 2021-02-07 | Discharge: 2021-02-07 | Disposition: A | Discharge status: Home / Self Care | Payer: Self-pay | Attending: Emergency Medicine | Admitting: Emergency Medicine

## 2021-02-07 DIAGNOSIS — L308 Other specified dermatitis: Secondary | ICD-10-CM | POA: Insufficient documentation

## 2021-02-07 MED ORDER — TRIAMCINOLONE ACETONIDE 0.5 % EX OINT: 1.0000 "application " | TOPICAL_OINTMENT | Freq: Two times a day (BID) | CUTANEOUS | 0 refills | Status: AC

## 2021-02-07 NOTE — ED Provider Notes (Signed)
Emergency Medicine Provider Triage Evaluation Note  Jason Stevens , a 25 y.o. male  was evaluated in triage.  Pt complains of rash on fingers/  Review of Systems  Positive: Rash  Negative: No fever  Physical Exam  BP 134/85 (BP Location: Left Arm)   Pulse 72   Temp 98.3 F (36.8 C) (Oral)   Resp 16   SpO2 99%  Gen:   Awake, no distress   Resp:  Normal effort  MSK:   Moves extremities without difficulty  Other:   Medical Decision Making  Medically screening exam initiated at 11:48 AM.  Appropriate orders placed.  Jason Stevens was informed that the remainder of the evaluation will be completed by another provider, this initial triage assessment does not replace that evaluation, and the importance of remaining in the ED until their evaluation is complete.     Elson Areas, [place] 02/07/21 1148    Bethann Berkshire, MD 02/09/21 1006

## 2021-02-07 NOTE — ED Provider Notes (Signed)
[facility] EMERGENCY DEPARTMENT Provider Note   CSN: [id number] Arrival date & time: 02/07/21  1109     History Chief Complaint  Patient presents with   Rash    Jason Stevens is a 25 y.o. male.  The history is provided by the patient. No language interpreter was used.  Rash Quality: blistering and itchiness   Severity:  Mild Onset quality:  Gradual Duration:  1 day Timing:  Constant Chronicity:  New Relieved by:  Nothing Worsened by:  Nothing Ineffective treatments:  None tried Associated symptoms: no fever   Pt complains of a rash on his finger.  Pt reports areas are itchy.  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 No pertinent past medical history.  There are no problems to display for this patient.   History reviewed. No pertinent surgical history.     Family History  Problem Relation Age of Onset   Cancer Mother     Social History   Tobacco Use   Smoking status: Never   Smokeless tobacco: Never  Vaping Use   Vaping Use: Never used  Substance Use Topics   Alcohol use: No   Drug use: Yes    Types: Marijuana    Comment: every now and then    Home Medications Prior to Admission medications   Medication Sig Start Date End Date Taking? Authorizing Provider  albuterol (VENTOLIN HFA) 108 (90 Base) MCG/ACT inhaler Inhale 1-2 puffs into the lungs every 6 (six) hours as needed for wheezing or shortness of breath. 08/04/20   Wynetta Fines, MD  DM-Doxylamine-Acetaminophen (NYQUIL COLD & FLU PO) Take 30 mLs by mouth at bedtime as needed (sleep).    [provider]  ibuprofen (ADVIL) 600 MG tablet Take 1 tablet (600 mg total) by mouth every 6 (six) hours as needed for up to 30 doses for mild pain or moderate pain. Take with food 07/01/20   Terald Sleeper, MD  omeprazole (PRILOSEC) 20 MG capsule Take 1 capsule (20 mg total) by mouth daily for 14 days. 07/14/17 07/28/17  Shaune Pollack, MD  ondansetron (ZOFRAN ODT) 4 MG disintegrating tablet Take 1 tablet (4 mg total) by mouth every 8 (eight) hours as needed for nausea or vomiting. 07/14/17   Shaune Pollack, MD  ondansetron (ZOFRAN) 4 MG tablet Take 1 tablet (4 mg total) by mouth every 6 (six) hours. 12/02/17   Hedges, Tinnie Gens, PA-C  Pseudoephedrine-APAP-DM (DAYQUIL MULTI-SYMPTOM COLD/FLU PO) Take 30 mLs by mouth daily as needed (pain).    [provider]    Allergies    Patient has no known allergies.  Review of Systems   Review of Systems  Constitutional:  Negative for fever.  Skin:  Positive for rash.  All other systems reviewed and are negative.  Physical Exam Updated Vital Signs BP 134/85 (BP Location: Left Arm)   Pulse 72   Temp 98.3 F (36.8 C) (Oral)   Resp 16   SpO2 99%   Physical Exam Vitals reviewed.  Musculoskeletal:        General: Normal range of motion.  Skin:    General: Skin is warm.     Findings: Rash present.     Comments: Small raised pimples finger,    Neurological:     General: No focal deficit present.     Mental Status: He is alert.  Psychiatric:        Mood and Affect: Mood normal.  ED Results / Procedures / Treatments   Labs (all labs ordered are listed, but only abnormal results are displayed) Labs Reviewed - No data to display  EKG None  Radiology DG Chest 2 View  Result Date: 02/06/2021 CLINICAL DATA:  Syncope EXAM: CHEST - 2 VIEW COMPARISON:  07/14/2017 FINDINGS: The heart size and mediastinal contours are within normal limits. Both lungs are clear. The visualized skeletal structures are unremarkable. IMPRESSION: No active cardiopulmonary disease. Electronically Signed   By: Duanne Guess D.O.   On: 02/06/2021 17:17    Procedures Procedures   Medications Ordered in ED Medications - No data to display  ED Course  I have reviewed the triage vital signs and the nursing notes.  Pertinent labs & imaging results that were available during my  care of the patient were reviewed by me and considered in my medical decision making (see chart for details).    MDM Rules/Calculators/A&P                            Final Clinical Impression(s) / ED Diagnoses Final diagnoses:  Other eczema    Rx / DC Orders ED Discharge Orders          Ordered    triamcinolone ointment (KENALOG) 0.5 %  2 times daily        02/07/21 1605          An After Visit Summary was printed and given to the patient.    Elson Areas, [place] 02/07/21 1606    Bethann Berkshire, MD 02/09/21 1006

## 2021-02-07 NOTE — Discharge Instructions (Addendum)
Keep hands dry,  Avoid chemical exposures

## 2021-02-07 NOTE — ED Triage Notes (Signed)
Pt reports rash and burning sensation to his hands since yesterday. Denies rash being anywhere else on body, denies itching. No acute distress is noted at triage.

## 2022-04-13 ENCOUNTER — Encounter (HOSPITAL_COMMUNITY): Payer: Self-pay

## 2022-04-13 ENCOUNTER — Emergency Department (HOSPITAL_COMMUNITY)
Admission: EM | Admit: 2022-04-13 | Discharge: 2022-04-14 | Disposition: A | Discharge status: Home / Self Care | Payer: Self-pay | Attending: Emergency Medicine | Admitting: Emergency Medicine

## 2022-04-13 ENCOUNTER — Other Ambulatory Visit: Payer: Self-pay

## 2022-04-13 DIAGNOSIS — Z711 Person with feared health complaint in whom no diagnosis is made: Secondary | ICD-10-CM | POA: Insufficient documentation

## 2022-04-13 DIAGNOSIS — N4889 Other specified disorders of penis: Secondary | ICD-10-CM | POA: Insufficient documentation

## 2022-04-13 DIAGNOSIS — R300 Dysuria: Secondary | ICD-10-CM | POA: Insufficient documentation

## 2022-04-13 NOTE — ED Provider Notes (Signed)
[facility] Emergency Department Provider Note MRN:  [id number]  Arrival date & time: 04/14/22     Chief Complaint   Penis Pain   History of Present Illness   Jason Stevens is a 26 y.o. year-old male presents to the ED with chief complaint of dysuria for the past few days.  He denies any penile discharge.  He denies any masses, sores, or lesions to the testicle or penis.  He states that he did have unprotected sex and is concerned about STD.  He denies fevers, chills, nausea, vomiting.  Denies any flank pain.  History provided by patient.   Review of Systems  Pertinent positive and negative review of systems noted in HPI.    Physical Exam   Vitals:   04/14/22 0000 04/14/22 0030  BP: 133/83   Pulse: 85   Resp:  16  Temp:    SpO2:  100%    CONSTITUTIONAL:  well-appearing, NAD NEURO:  Alert and oriented x 3, CN 3-12 grossly intact EYES:  eyes equal and reactive ENT/NECK:  Supple, no stridor  CARDIO:   appears well-perfused  PULM:  No respiratory distress,  GI/GU:  non-distended, chaperone present for exam, uncircumcised male, no masses, sores, or lesions, no discharge MSK/SPINE:  No gross deformities, no edema, moves all extremities  SKIN:  no rash, atraumatic   *Additional and/or pertinent findings included in MDM below  Diagnostic and Interventional Summary    EKG Interpretation  Date/Time:    Ventricular Rate:    PR Interval:    QRS Duration:   QT Interval:    QTC Calculation:   R Axis:     Text Interpretation:         Labs Reviewed  URINALYSIS, ROUTINE W REFLEX MICROSCOPIC  GC/CHLAMYDIA PROBE AMP ([place]) NOT AT [facility]    No orders to display    Medications  cefTRIAXone (ROCEPHIN) injection 500 mg (500 mg Intramuscular Given 04/14/22 0048)  doxycycline (VIBRA-TABS) tablet 100 mg (100 mg Oral Given 04/14/22 0048)     Procedures  /  Critical Care Procedures  ED Course and Medical Decision Making  I have  reviewed the triage vital signs, the nursing notes, and pertinent available records from the EMR.  Social Determinants Affecting Complexity of Care: Patient has no clinically significant social determinants affecting this chief complaint..   ED Course:    Medical Decision Making Patient here for dysuria, high degree of suspicion for STD.  We will treat with IM Rocephin and discharged with doxycycline.  Amount and/or Complexity of Data Reviewed Labs: ordered.    Details: Urinalysis is inconsistent with UTI Gonorrhea/chlamydia swab pending  Risk Prescription drug management.     Consultants: No consultations were needed in caring for this patient.   Treatment and Plan: Emergency department workup does not suggest an emergent condition requiring admission or immediate intervention beyond  what has been performed at this time. The patient is safe for discharge and has  been instructed to return immediately for worsening symptoms, change in  symptoms or any other concerns    Final Clinical Impressions(s) / ED Diagnoses     ICD-10-CM   1. Dysuria  R30.0     2. Concern about STD in male without diagnosis  Z71.1       ED Discharge Orders          Ordered    doxycycline (VIBRAMYCIN) 100 MG capsule  2 times daily        04/14/22 0058  Discharge Instructions Discussed with and Provided to Patient:   Discharge Instructions   None      Montine Circle, Hershal Coria 04/14/22 [id number]    Palumbo, April, MD 04/14/22 Rogene Houston

## 2022-04-13 NOTE — ED Triage Notes (Signed)
Pt c/o penis pain for last few days. Pt c/o painful urination. Pt denies discharge. Pt states he has had unprotected sex.

## 2022-04-14 LAB — URINALYSIS, ROUTINE W REFLEX MICROSCOPIC
Bilirubin Urine: NEGATIVE
Glucose, UA: NEGATIVE mg/dL
Hgb urine dipstick: NEGATIVE
Ketones, ur: NEGATIVE mg/dL
Leukocytes,Ua: NEGATIVE
Nitrite: NEGATIVE
Protein, ur: NEGATIVE mg/dL
Specific Gravity, Urine: 1.025 (ref 1.005–1.030)
pH: 5 (ref 5.0–8.0)

## 2022-04-14 MED ORDER — DOXYCYCLINE HYCLATE 100 MG PO TABS
100.0000 mg | ORAL_TABLET | Freq: Two times a day (BID) | ORAL | 0 refills | Status: DC
  Filled 2022-04-14 – 2022-05-23 (×2): qty 20, 10d supply, fill #0

## 2022-04-14 MED ORDER — DOXYCYCLINE HYCLATE 100 MG PO TABS
100.0000 mg | ORAL_TABLET | Freq: Once | ORAL | Status: AC
  Administered 2022-04-14: 100 mg via ORAL
  Filled 2022-04-14: qty 1

## 2022-04-14 MED ORDER — CEFTRIAXONE SODIUM 1 G IJ SOLR
500.0000 mg | Freq: Once | INTRAMUSCULAR | Status: AC
  Administered 2022-04-14: 500 mg via INTRAMUSCULAR
  Filled 2022-04-14: qty 10

## 2022-04-16 ENCOUNTER — Other Ambulatory Visit: Payer: Self-pay

## 2022-04-16 LAB — GC/CHLAMYDIA PROBE AMP (~~LOC~~) NOT AT ARMC
Chlamydia: NEGATIVE
Comment: NEGATIVE
Comment: NORMAL
Neisseria Gonorrhea: NEGATIVE

## 2022-04-23 ENCOUNTER — Other Ambulatory Visit: Payer: Self-pay

## 2022-05-23 ENCOUNTER — Other Ambulatory Visit: Payer: Self-pay

## 2022-05-23 MED ORDER — ONDANSETRON 4 MG PO TBDP
4.0000 mg | ORAL_TABLET | Freq: Four times a day (QID) | ORAL | 0 refills | Status: AC
  Filled 2022-05-23: qty 12, 3d supply, fill #0

## 2022-05-23 MED ORDER — OMEPRAZOLE 20 MG PO CPDR
20.0000 mg | DELAYED_RELEASE_CAPSULE | Freq: Every day | ORAL | 0 refills | Status: AC
  Filled 2022-05-23: qty 30, 30d supply, fill #0

## 2022-05-25 ENCOUNTER — Encounter (HOSPITAL_COMMUNITY): Payer: Self-pay | Admitting: *Deleted

## 2022-05-25 ENCOUNTER — Emergency Department (HOSPITAL_COMMUNITY)
Admission: EM | Admit: 2022-05-25 | Discharge: 2022-05-25 | Disposition: A | Discharge status: Home / Self Care | Payer: Medicaid Other | Attending: Emergency Medicine | Admitting: Emergency Medicine

## 2022-05-25 ENCOUNTER — Other Ambulatory Visit: Payer: Self-pay

## 2022-05-25 ENCOUNTER — Emergency Department (HOSPITAL_COMMUNITY): Payer: Medicaid Other

## 2022-05-25 DIAGNOSIS — R109 Unspecified abdominal pain: Secondary | ICD-10-CM

## 2022-05-25 LAB — CBC WITH DIFFERENTIAL/PLATELET
Abs Immature Granulocytes: 0.02 10*3/uL (ref 0.00–0.07)
Basophils Absolute: 0 10*3/uL (ref 0.0–0.1)
Basophils Relative: 0 %
Eosinophils Absolute: 0.1 10*3/uL (ref 0.0–0.5)
Eosinophils Relative: 1 %
HCT: 43.4 % (ref 39.0–52.0)
Hemoglobin: 14.5 g/dL (ref 13.0–17.0)
Immature Granulocytes: 0 %
Lymphocytes Relative: 35 %
Lymphs Abs: 2.8 10*3/uL (ref 0.7–4.0)
MCH: 27.9 pg (ref 26.0–34.0)
MCHC: 33.4 g/dL (ref 30.0–36.0)
MCV: 83.6 fL (ref 80.0–100.0)
Monocytes Absolute: 0.6 10*3/uL (ref 0.1–1.0)
Monocytes Relative: 8 %
Neutro Abs: 4.3 10*3/uL (ref 1.7–7.7)
Neutrophils Relative %: 56 %
Platelets: 386 10*3/uL (ref 150–400)
RBC: 5.19 MIL/uL (ref 4.22–5.81)
RDW: 12.5 % (ref 11.5–15.5)
WBC: 7.8 10*3/uL (ref 4.0–10.5)
nRBC: 0 % (ref 0.0–0.2)

## 2022-05-25 LAB — COMPREHENSIVE METABOLIC PANEL
ALT: 17 U/L (ref 0–44)
AST: 13 U/L — ABNORMAL LOW (ref 15–41)
Albumin: 4.1 g/dL (ref 3.5–5.0)
Alkaline Phosphatase: 52 U/L (ref 38–126)
Anion gap: 10 (ref 5–15)
BUN: 11 mg/dL (ref 6–20)
CO2: 27 mmol/L (ref 22–32)
Calcium: 9.2 mg/dL (ref 8.9–10.3)
Chloride: 102 mmol/L (ref 98–111)
Creatinine, Ser: 1.16 mg/dL (ref 0.61–1.24)
GFR, Estimated: >60 mL/min (ref >60)
Glucose, Bld: 110 mg/dL — ABNORMAL HIGH (ref 70–99)
Potassium: 3.5 mmol/L (ref 3.5–5.1)
Sodium: 139 mmol/L (ref 135–145)
Total Bilirubin: 0.8 mg/dL (ref 0.3–1.2)
Total Protein: 7.1 g/dL (ref 6.5–8.1)

## 2022-05-25 LAB — LIPASE, BLOOD: Lipase: 35 U/L (ref 11–51)

## 2022-05-25 MED ORDER — ACETAMINOPHEN 500 MG PO TABS
1000.0000 mg | ORAL_TABLET | Freq: Once | ORAL | Status: AC
  Administered 2022-05-25: 1000 mg via ORAL
  Filled 2022-05-25: qty 2

## 2022-05-25 MED ORDER — KETOROLAC TROMETHAMINE 60 MG/2ML IM SOLN
60.0000 mg | Freq: Once | INTRAMUSCULAR | Status: DC
  Filled 2022-05-25: qty 2

## 2022-05-25 MED ORDER — KETOROLAC TROMETHAMINE 15 MG/ML IJ SOLN
30.0000 mg | Freq: Once | INTRAMUSCULAR | Status: AC
  Administered 2022-05-25: 30 mg via INTRAMUSCULAR
  Filled 2022-05-25: qty 2

## 2022-05-25 NOTE — ED Notes (Signed)
To ct

## 2022-05-25 NOTE — ED Provider Notes (Signed)
[facility] EMERGENCY DEPARTMENT Provider Note   CSN: [id number] Arrival date & time: 05/25/22  1804     History Chief Complaint  Patient presents with   Abdominal Pain    HPI Jason Stevens is a 26 y.o. male presenting for left-sided flank pain.  He states that he has had it for the past week and has been acutely worse over the last 3 days.  It is chronic in nature and worse when he lies down.  It does not hurt in his chest.  He denies fevers or chills, nausea vomiting, syncope or shortness of breath.  He is otherwise ambulatory tolerating p.o. intake.  Patient's recorded medical, surgical, social, medication list and allergies were reviewed in the Snapshot window as part of the initial history.   Review of Systems   Review of Systems  Constitutional:  Negative for chills and fever.  HENT:  Negative for ear pain and sore throat.   Eyes:  Negative for pain and visual disturbance.  Respiratory:  Negative for cough and shortness of breath.   Cardiovascular:  Negative for chest pain and palpitations.  Gastrointestinal:  Negative for abdominal pain and vomiting.  Genitourinary:  Positive for flank pain. Negative for dysuria and hematuria.  Musculoskeletal:  Negative for arthralgias and back pain.  Skin:  Negative for color change and rash.  Neurological:  Negative for seizures and syncope.  All other systems reviewed and are negative.   Physical Exam Updated Vital Signs BP (!) 132/95   Pulse 64   Temp 99.1 F (37.3 C)   Resp 16   Ht 5\' 8"  (1.727 m)   Wt 90.7 kg   SpO2 98%   BMI 30.41 kg/m  Physical Exam Vitals and nursing note reviewed.  Constitutional:      General: He is not in acute distress.    Appearance: He is well-developed.  HENT:     Head: Normocephalic and atraumatic.  Eyes:     Conjunctiva/sclera: Conjunctivae normal.  Cardiovascular:     Rate and Rhythm: Normal rate and regular rhythm.     Heart sounds: No murmur  heard. Pulmonary:     Effort: Pulmonary effort is normal. No respiratory distress.     Breath sounds: Normal breath sounds.  Abdominal:     Palpations: Abdomen is soft.     Tenderness: There is no abdominal tenderness. There is left CVA tenderness. There is no right CVA tenderness or guarding. Negative signs include Murphy's sign.  Musculoskeletal:        General: No swelling.     Cervical back: Neck supple.  Skin:    General: Skin is warm and dry.     Capillary Refill: Capillary refill takes less than 2 seconds.  Neurological:     Mental Status: He is alert.  Psychiatric:        Mood and Affect: Mood normal.      ED Course/ Medical Decision Making/ A&P    Procedures Procedures   Medications Ordered in ED Medications  ketorolac (TORADOL) 15 MG/ML injection 30 mg (30 mg Intramuscular Given 05/25/22 2128)  acetaminophen (TYLENOL) tablet 1,000 mg (1,000 mg Oral Given 05/25/22 2127)   Medical Decision Making:   Jason Stevens is a 26 y.o. male who presented to the ED today with left flank pain pain, detailed above.    Patient's presentation is complicated by their history of elevated BMI.  Patient placed on continuous vitals and telemetry monitoring while in ED which was reviewed periodically.  Complete initial physical exam performed, notably the patient  was hemodynamically stable in no acute distress.  He does have left CVA tenderness on exam.     Reviewed and confirmed nursing documentation for past medical history, family history, social history.    Initial Assessment:   With the patient's presentation of left flank pain pain, most likely diagnosis is musculoskeletal etiology. Other diagnoses were considered including (but not limited to) gastroenteritis, colitis, small bowel obstruction, appendicitis, cholecystitis, pancreatitis, nephrolithiasis, UTI, pyleonephritis, testicular torsion. These are considered less likely due to history of present illness and physical  exam findings.   This is most consistent with an acute life/limb threatening illness complicated by underlying chronic conditions.   Initial Plan:  CBC/CMP to evaluate for underlying infectious/metabolic etiology for patient's abdominal pain  Lipase to evaluate for pancreatitis  CT Ab/pelvis without contrast due to favored nephrolithiasis over GI etiology for patient's abdominal pain  Urinalysis and repeat physical assessment to evaluate for UTI/Pyelonpehritis  Empiric management of symptoms with escalating pain control and antiemetics as needed.   Initial Study Results:   Laboratory  All laboratory results reviewed without evidence of clinically relevant pathology.     Radiology All images reviewed independently. Agree with radiology report at this time.   CT Renal Stone Study  Result Date: 05/25/2022 CLINICAL DATA:  Left flank pain. EXAM: CT ABDOMEN AND PELVIS WITHOUT CONTRAST TECHNIQUE: Multidetector CT imaging of the abdomen and pelvis was performed following the standard protocol without IV contrast. RADIATION DOSE REDUCTION: This exam was performed according to the departmental dose-optimization program which includes automated exposure control, adjustment of the mA and/or kV according to patient size and/or use of iterative reconstruction technique. COMPARISON:  CT with IV contrast 04/04/2019 FINDINGS: Lower chest: No acute abnormality. Hepatobiliary: No focal liver abnormality is seen. No calcified gallstones, gallbladder wall thickening, or biliary dilatation. Pancreas: Unremarkable without contrast.  No inflammatory changes. Spleen: Unremarkable without contrast.  No splenomegaly. Adrenals/Urinary Tract: There is no adrenal mass. No focal renal cortical abnormality is visible without contrast. There is a 1 mm nonobstructive caliceal stone in the inferior pole of the right kidney. No intrarenal stones are seen elsewhere or on the left. There is no hydroureteronephrosis or ureteral stone.  The bladder is unremarkable allowing for the degree of distention. Stomach/Bowel: No dilatation or wall thickening including of the appendix. Vascular/Lymphatic: No significant vascular findings are present. No enlarged abdominal or pelvic lymph nodes. Reproductive: No prostatomegaly. Other: There is no incarcerated hernia. The anterior wall is intact. There is no free air, free hemorrhage or free fluid. Musculoskeletal: No acute or significant osseous findings. IMPRESSION: 1. No acute noncontrast CT findings. 2. Nonobstructive nephrolithiasis on the right. Electronically Signed   By: Almira Bar M.D.   On: 05/25/2022 21:55   DG Abdomen 1 View  Result Date: 05/25/2022 CLINICAL DATA:  Left flank pain. Assess stool burden. EXAM: ABDOMEN - 1 VIEW COMPARISON:  CT 04/04/2019 FINDINGS: Divided supine views of the abdomen obtained. Small volume of stool throughout the colon. No small bowel dilatation or evidence of obstruction. No abnormal rectal distention. No radiopaque calculi or abnormal soft tissue calcifications. Lower lung bases are clear. No acute osseous findings. IMPRESSION: Small volume of colonic stool.  Normal bowel gas pattern. Electronically Signed   By: Narda Rutherford M.D.   On: 05/25/2022 19:44    Final Reassessment and Plan:   Evaluated patient at bedside after administration of Tylenol and Toradol, he has had some mild symptomatic improvement.  Patient's description of the pain now appears to be more musculoskeletal with a lateral inferior low back pain.  He cannot think of any triggering or inciting event.  However the gradual onset in the setting of a reassuring CT scan is more consistent with musculoskeletal etiology as well. Given reassuring lab work and negative CT scan for nephrolithiasis, I believe patient can be safely managed in the outpatient setting with supportive care and strict return precautions if he has any symptomatic changes.  Disposition:  I have considered need for  hospitalization, however, considering all of the above, I believe this patient is stable for discharge at this time.  Patient/family educated about specific return precautions for given chief complaint and symptoms.  Patient/family educated about follow-up with PCP.    Patient/family expressed understanding of return precautions and need for follow-up. Patient spoken to regarding all imaging and laboratory results and appropriate follow up for these results. All education provided in verbal form with additional information in written form. Time was allowed for answering of patient questions. Patient discharged.    Emergency Department Medication Summary:   Medications  ketorolac (TORADOL) 15 MG/ML injection 30 mg (30 mg Intramuscular Given 05/25/22 2128)  acetaminophen (TYLENOL) tablet 1,000 mg (1,000 mg Oral Given 05/25/22 2127)   Clinical Impression:  1. Left flank pain      Discharge   Final Clinical Impression(s) / ED Diagnoses Final diagnoses:  Left flank pain    Rx / DC Orders ED Discharge Orders     None         Tretha Sciara, MD 05/25/22 2228

## 2022-05-25 NOTE — ED Provider Triage Note (Addendum)
Emergency Medicine Provider Triage Evaluation Note  Jason Stevens , a 26 y.o. male  was evaluated in triage.  Pt complains of left flank pain xseveral days. No associated urinary symptoms. No N/V. Reports generalized abdominal pain. States symptoms have interrupted sleep. Reports pain with stooling.  No blood in stools.  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  Review of Systems  Positive: Abdominal pain, flank pain Negative: Fever, chills  Physical Exam  BP 127/89   Pulse 66   Temp 98.4 F (36.9 C) (Oral)   Resp 16   SpO2 98%  Gen:   Awake, no distress   Resp:  Normal effort  MSK:   Moves extremities without difficulty  Other:  TTP of left flank, mild generalized abdominal ttp w/o guarding  Medical Decision Making  Medically screening exam initiated at 6:41 PM.  Appropriate orders placed.  Jason Stevens was informed that the remainder of the evaluation will be completed by another provider, this initial triage assessment does not replace that evaluation, and the importance of remaining in the ED until their evaluation is complete.     Jason Stevens, [place] 05/25/22 1847   Noncontrast CT at UC on 10/31, no acute findings.    Jason Stevens, [place] 05/25/22 1849

## 2022-05-25 NOTE — Discharge Instructions (Signed)
You were seen today for left flank pain. We did not identify any emergent cause for your symptoms. Your evaluation is most consistent with nonspecific muscle strain.   Plan and next steps:   The following may be helpful in managing your symptoms:   Pain- Lidocaine Patches  Apply to affected area for up to 12 hours at a time.   Pain/Fever- Adult Tylenol dosing:  650 mg orally every 4 to 6 hours as needed, MAX: 3250 mg/24 hours   (Extra-strength) 1000 mg orally every 6 hours as needed; MAX: 3000 mg/24 hours   Do not use if you have liver disease. Read the label on the bottle.   Pain/Fever- Adult Ibuprofen Dosing  200 to 400 mg orally every 4 to 6 hours as needed; MAX 1200 mg/day; do not take longer than 10 days   Do not use if you have kidney disease. Read the label on the bottle   Findings:  You may see all of your lab and imaging results utilizing our online portal! Look in this document or ask a team member for your mychart* access information. The most notable results have additionally been verbally communicated with you and your bedside family.    Follow-up Plan:   Follow up with the patient's normal primary care provider for monitoring of this condition within 48 hours.   Signs/Symptoms that would warrant return to the ED:  Please return to the ED if you experience worsening of symptoms or any abrupt changes in your health. Standard of care precautions for your chief complaint have already been verbally communicated with you. Always be on alert for fevers, chills, shortness of breath, chest pains, or sudden changes that warrant immediate evaluation.    Thank you for allowing [place] to be a part of you and your families' care.   Tretha Sciara MD

## 2022-05-25 NOTE — ED Triage Notes (Signed)
The pt has had some flank pain for 2 days no bloody urine no freguemcy no n or v

## 2022-05-28 ENCOUNTER — Other Ambulatory Visit: Payer: Self-pay

## 2022-05-28 MED ORDER — NAPROXEN 500 MG PO TABS
ORAL_TABLET | ORAL | 0 refills | Status: AC
  Filled 2022-05-28: qty 30, 15d supply, fill #0

## 2022-05-30 ENCOUNTER — Other Ambulatory Visit: Payer: Self-pay

## 2022-06-02 ENCOUNTER — Emergency Department (HOSPITAL_COMMUNITY)
Admission: EM | Admit: 2022-06-02 | Discharge: 2022-06-02 | Disposition: A | Discharge status: Home / Self Care | Payer: Self-pay | Attending: Emergency Medicine | Admitting: Emergency Medicine

## 2022-06-02 ENCOUNTER — Emergency Department (HOSPITAL_COMMUNITY): Payer: Self-pay

## 2022-06-02 ENCOUNTER — Encounter (HOSPITAL_COMMUNITY): Payer: Self-pay

## 2022-06-02 ENCOUNTER — Other Ambulatory Visit: Payer: Self-pay

## 2022-06-02 DIAGNOSIS — R030 Elevated blood-pressure reading, without diagnosis of hypertension: Secondary | ICD-10-CM | POA: Insufficient documentation

## 2022-06-02 DIAGNOSIS — R1013 Epigastric pain: Secondary | ICD-10-CM | POA: Insufficient documentation

## 2022-06-02 DIAGNOSIS — R0781 Pleurodynia: Secondary | ICD-10-CM | POA: Insufficient documentation

## 2022-06-02 DIAGNOSIS — R109 Unspecified abdominal pain: Secondary | ICD-10-CM

## 2022-06-02 LAB — BASIC METABOLIC PANEL
Anion gap: 5 (ref 5–15)
BUN: 16 mg/dL (ref 6–20)
CO2: 27 mmol/L (ref 22–32)
Calcium: 9.1 mg/dL (ref 8.9–10.3)
Chloride: 106 mmol/L (ref 98–111)
Creatinine, Ser: 0.96 mg/dL (ref 0.61–1.24)
GFR, Estimated: >60 mL/min (ref >60)
Glucose, Bld: 90 mg/dL (ref 70–99)
Potassium: 3.8 mmol/L (ref 3.5–5.1)
Sodium: 138 mmol/L (ref 135–145)

## 2022-06-02 LAB — CBC
HCT: 46 % (ref 39.0–52.0)
Hemoglobin: 15.5 g/dL (ref 13.0–17.0)
MCH: 29 pg (ref 26.0–34.0)
MCHC: 33.7 g/dL (ref 30.0–36.0)
MCV: 86 fL (ref 80.0–100.0)
Platelets: 351 10*3/uL (ref 150–400)
RBC: 5.35 MIL/uL (ref 4.22–5.81)
RDW: 13.2 % (ref 11.5–15.5)
WBC: 8.9 10*3/uL (ref 4.0–10.5)
nRBC: 0 % (ref 0.0–0.2)

## 2022-06-02 LAB — URINALYSIS, ROUTINE W REFLEX MICROSCOPIC
Bilirubin Urine: NEGATIVE
Glucose, UA: NEGATIVE mg/dL
Hgb urine dipstick: NEGATIVE
Ketones, ur: NEGATIVE mg/dL
Leukocytes,Ua: NEGATIVE
Nitrite: NEGATIVE
Protein, ur: NEGATIVE mg/dL
Specific Gravity, Urine: 1.01 (ref 1.005–1.030)
pH: 5 (ref 5.0–8.0)

## 2022-06-02 MED ORDER — IOHEXOL 300 MG/ML  SOLN
100.0000 mL | Freq: Once | INTRAMUSCULAR | Status: AC | PRN
  Administered 2022-06-02: 100 mL via INTRAVENOUS

## 2022-06-02 NOTE — ED Notes (Signed)
Pt ambulatory to er room number 16, pt c/o abd pain for the past several weeks, abd soft with bowel sounds, resps even and unlabored, md at bedside

## 2022-06-02 NOTE — ED Provider Notes (Signed)
Jason Stevens   CSN: [id number] Arrival date & time: 06/02/22  1049     History  Chief Complaint  Patient presents with   Flank Pain    Jason Stevens is a 26 y.o. male.  Patient is a 26 year old male who presents with abdominal/flank pain.  Jason Stevens states has been going on for about the last month.  Jason Stevens points to the top of his abdomen says it goes across his abdomen.  It also hurts on his lower ribs.  Jason Stevens says it waxes and wanes in intensity.  Jason Stevens does not have any associated shortness of breath.  No cough or cold symptoms.  No nausea or vomiting.  No fevers.  No urinary symptoms.  Jason Stevens does not say that it is worse with eating or activity.  Nothing really seems to change it.  Jason Stevens has been seen in the emergency department in various emergency departments 4 times over the last approximate 2 weeks.  Jason Stevens has tried Prilosec which she says it really seemed to help.  Jason Stevens is currently taking Naprosyn which does not seem to be helping much.  Jason Stevens currently does not have a primary care doctor.  Jason Stevens does state that sometimes Jason Stevens has some testicular pain.       Home Medications Prior to Admission medications   Medication Sig Start Date End Date Taking? Authorizing Provider  albuterol (VENTOLIN HFA) 108 (90 Base) MCG/ACT inhaler Inhale 1-2 puffs into the lungs every 6 (six) hours as needed for wheezing or shortness of breath. 08/04/20   Wynetta Fines, MD  DM-Doxylamine-Acetaminophen (NYQUIL COLD & FLU PO) Take 30 mLs by mouth at bedtime as needed (sleep).    [provider]  doxycycline (VIBRA-TABS) 100 MG tablet Take 1 tablet (100 mg total) by mouth 2 (two) times daily. 04/14/22   Roxy Horseman, PA-C  ibuprofen (ADVIL) 600 MG tablet Take 1 tablet (600 mg total) by mouth every 6 (six) hours as needed for up to 30 doses for mild pain or moderate pain. Take with food 07/01/20   Terald Sleeper, MD  naproxen (NAPROSYN) 500 MG tablet Take 1 tablet  by mouth 2 times daily 05/27/22     omeprazole (PRILOSEC) 20 MG capsule Take 1 capsule (20 mg total) by mouth daily for 14 days. 07/14/17 07/28/17  Shaune Pollack, MD  omeprazole (PRILOSEC) 20 MG capsule Take 1 capsule (20 mg total) by mouth daily. 05/22/22     ondansetron (ZOFRAN ODT) 4 MG disintegrating tablet Take 1 tablet (4 mg total) by mouth every 8 (eight) hours as needed for nausea or vomiting. 07/14/17   Shaune Pollack, MD  ondansetron (ZOFRAN) 4 MG tablet Take 1 tablet (4 mg total) by mouth every 6 (six) hours. 12/02/17   Hedges, Tinnie Gens, PA-C  ondansetron (ZOFRAN-ODT) 4 MG disintegrating tablet Take 1 tablet (4 mg total) by mouth every 6 (six) hours 05/22/22     Pseudoephedrine-APAP-DM (DAYQUIL MULTI-SYMPTOM COLD/FLU PO) Take 30 mLs by mouth daily as needed (pain).    [provider]  triamcinolone ointment (KENALOG) 0.5 % Apply 1 application topically 2 (two) times daily. 02/07/21   Elson Areas, PA-C      Allergies    Patient has no known allergies.    Review of Systems   Review of Systems  Constitutional:  Negative for chills, diaphoresis, fatigue and fever.  HENT:  Negative for congestion, rhinorrhea and sneezing.   Eyes: Negative.   Respiratory:  Negative for cough, chest  tightness and shortness of breath.   Cardiovascular:  Negative for chest pain and leg swelling.  Gastrointestinal:  Positive for abdominal pain. Negative for blood in stool, diarrhea, nausea and vomiting.  Genitourinary:  Negative for difficulty urinating, flank pain, frequency and hematuria.  Musculoskeletal:  Negative for arthralgias and back pain.  Skin:  Negative for rash.  Neurological:  Negative for dizziness, speech difficulty, weakness, numbness and headaches.    Physical Exam Updated Vital Signs BP (!) 153/106 (BP Location: Left Arm)   Pulse 73   Temp (!) 97.5 F (36.4 C) (Oral)   Resp 18   SpO2 100%  Physical Exam Constitutional:      Appearance: Jason Stevens is well-developed.  HENT:      Head: Normocephalic and atraumatic.  Eyes:     Pupils: Pupils are equal, round, and reactive to light.  Cardiovascular:     Rate and Rhythm: Normal rate and regular rhythm.     Heart sounds: Normal heart sounds.  Pulmonary:     Effort: Pulmonary effort is normal. No respiratory distress.     Breath sounds: Normal breath sounds. No wheezing or rales.  Chest:     Chest wall: No tenderness.  Abdominal:     General: Bowel sounds are normal.     Palpations: Abdomen is soft.     Tenderness: There is abdominal tenderness (Mild tenderness across the upper abdomen bilaterally in the epigastrium.  There is also some mild tenderness along his lower ribs.  No cough this are deformity). There is no guarding or rebound.  Genitourinary:    Comments: Genital exam was done with the nursing tech as a chaperone.  Normal-appearing external genitalia.  No tenderness noted to the testicles or scrotum.  No swelling.  No lymphadenopathy in the inguinal canals.  No hernias palpated. Musculoskeletal:        General: Normal range of motion.     Cervical back: Normal range of motion and neck supple.  Lymphadenopathy:     Cervical: No cervical adenopathy.  Skin:    General: Skin is warm and dry.     Findings: No rash.  Neurological:     Mental Status: Jason Stevens is alert and oriented to person, place, and time.     ED Results / Procedures / Treatments   Labs (all labs ordered are listed, but only abnormal results are displayed) Labs Reviewed  BASIC METABOLIC PANEL  CBC    EKG None  Radiology CT Abdomen Pelvis W Contrast  Result Date: 06/02/2022 CLINICAL DATA:  Bilateral flank pain, unknown cause was EXAM: CT ABDOMEN AND PELVIS WITH CONTRAST TECHNIQUE: Multidetector CT imaging of the abdomen and pelvis was performed using the standard protocol following bolus administration of intravenous contrast. RADIATION DOSE REDUCTION: This exam was performed according to the departmental dose-optimization program  which includes automated exposure control, adjustment of the mA and/or kV according to patient size and/or use of iterative reconstruction technique. CONTRAST:  OMNIPAQUE IOHEXOL 300 MG/ML  SOLN COMPARISON:  None Available. FINDINGS: Lower chest: No acute abnormality. Hepatobiliary: No focal liver abnormality. No gallstones, gallbladder wall thickening, or pericholecystic fluid. No biliary dilatation. Pancreas: No focal lesion. Normal pancreatic contour. No surrounding inflammatory changes. No main pancreatic ductal dilatation. Spleen: Normal in size without focal abnormality. Adrenals/Urinary Tract: No adrenal nodule bilaterally. Bilateral kidneys enhance symmetrically. No hydronephrosis. No hydroureter. The urinary bladder is unremarkable. Stomach/Bowel: Stomach is within normal limits. No evidence of bowel wall thickening or dilatation. Appendix appears normal. Vascular/Lymphatic: No abdominal  aorta or iliac aneurysm. No abdominal, pelvic, or inguinal lymphadenopathy. Reproductive: Prostate is unremarkable. Other: No intraperitoneal free fluid. No intraperitoneal free gas. No organized fluid collection. Musculoskeletal: No abdominal wall hernia or abnormality. No suspicious lytic or blastic osseous lesions. No acute displaced fracture. IMPRESSION: No acute intra-abdominal or intrapelvic abnormality. Electronically Signed   By: Tish Frederickson M.D.   On: 06/02/2022 17:19    Procedures Procedures    Medications Ordered in ED Medications  iohexol (OMNIPAQUE) 300 MG/ML solution 100 mL (100 mLs Intravenous Contrast Given 06/02/22 1651)    ED Course/ Medical Decision Making/ A&P                           Medical Decision Making Amount and/or Complexity of Data Reviewed Labs: ordered.   Patient presents with pain across his upper abdomen.  Jason Stevens also mentions that its little sore in his lower ribs.  Jason Stevens does not have any cough congestion or shortness of breath.  Jason Stevens does not have any respiratory  symptoms no pleuritic symptoms.  No hypoxia or tachypnea.  No symptoms that would be more suggestive of PE/pneumonia or pneumothorax.  It seems to be coming more from his abdomen as Jason Stevens is tender across his upper abdomen.  I have reviewed his chart.  Jason Stevens was seen on October 31 in an outside ED and had a CT scan of his abdomen pelvis at that time which is negative.  Jason Stevens was also seen on November 3 in the emergency department had a negative CT at that time.  Jason Stevens was seen in November 5 in the ED in [place] and had a negative CT scan at that point.  Jason Stevens has been prescribed most recently Naprosyn and Zofran.  Jason Stevens has not had much improvement.  Jason Stevens is currently well-appearing and does not appear to be significantly uncomfortable.  Jason Stevens does not have any testicular tenderness on exam.  CT scan was performed today that was ordered in triage prior to chart review that noted Jason Stevens has had 3 recent CT scans.  This was negative for acute abnormality.  Jason Stevens does not have any suggestions of gallbladder disease.  His labs are reviewed and are nonconcerning.  Jason Stevens did have a recent ED visit and had a normal lipase for similar symptoms.  No other suggestions of pancreatitis.  His LFTs done last week were normal.  No signs of small bowel obstruction.  No signs of colitis.  Jason Stevens does not have any change in his stools.  Potentially Jason Stevens could have some gastritis or peptic ulcer disease.  No bony lesions in his lower chest wall were noted on the CT scan.  I discussed the findings with the patient and the fact that Jason Stevens needs further outpatient care and follow-up.  His blood pressure was also elevated which will need outpatient follow-up.  His most recent 1 was 138/94.  We will give him referrals to the [facility] internal medicine clinic in [facility].  Discussed symptomatic care options.  Return precautions were given.  Final Clinical Impression(s) / ED Diagnoses Final diagnoses:  Flank pain  Elevated blood pressure reading     Rx / DC Orders ED Discharge Orders     None         Rolan Bucco, MD 06/02/22 1835

## 2022-06-02 NOTE — Discharge Instructions (Signed)
Make an appointment to follow-up with the primary care doctor for reassessment.  You also need to have your blood pressure monitored as it was elevated here in the emergency department.  Return to emergency room if you have any worsening symptoms.

## 2022-06-02 NOTE — ED Provider Triage Note (Signed)
Emergency Medicine Provider Triage Evaluation Note  Jason Stevens , a 26 y.o. male  was evaluated in triage.  Pt complains of bilateral flank pain which been ongoing for the past few weeks.  Patient is tearful in triage due to the pain.  Patient also complains of left-sided abdominal pain.  Patient states he was recently tested for HIV and does not know the results as of this time.  He denies urinary symptoms, shortness of breath, chest pain at this time  Review of Systems  Positive: As above Negative: As above  Physical Exam  BP 125/87 (BP Location: Right Arm)   Pulse 80   Temp 97.9 F (36.6 C) (Oral)   Resp 17   SpO2 93%  Gen:   Awake, patient tearful Resp:  Normal effort  MSK:   Moves extremities without difficulty  Other:    Medical Decision Making  Medically screening exam initiated at 12:14 PM.  Appropriate orders placed.  Jenny Omdahl was informed that the remainder of the evaluation will be completed by another provider, this initial triage assessment does not replace that evaluation, and the importance of remaining in the ED until their evaluation is complete.     Darrick Grinder, PA-C 06/02/22 1214

## 2022-06-02 NOTE — ED Triage Notes (Addendum)
Patient said he is having pain on both of the sides in his back. Has been going on for a month. No painful urination. York Spaniel he took an HIV test at the health department a month ago but is worried he does not know the results of it.

## 2022-06-28 ENCOUNTER — Emergency Department (HOSPITAL_COMMUNITY)
Admission: EM | Admit: 2022-06-28 | Discharge: 2022-06-29 | Disposition: A | Discharge status: Home / Self Care | Payer: Medicaid Other | Attending: Emergency Medicine | Admitting: Emergency Medicine

## 2022-06-28 ENCOUNTER — Other Ambulatory Visit: Payer: Self-pay

## 2022-06-28 DIAGNOSIS — Z5321 Procedure and treatment not carried out due to patient leaving prior to being seen by health care provider: Secondary | ICD-10-CM | POA: Insufficient documentation

## 2022-06-28 DIAGNOSIS — R21 Rash and other nonspecific skin eruption: Secondary | ICD-10-CM | POA: Insufficient documentation

## 2022-06-28 LAB — URINALYSIS, ROUTINE W REFLEX MICROSCOPIC
Bilirubin Urine: NEGATIVE
Glucose, UA: NEGATIVE mg/dL
Hgb urine dipstick: NEGATIVE
Ketones, ur: NEGATIVE mg/dL
Leukocytes,Ua: NEGATIVE
Nitrite: NEGATIVE
Protein, ur: NEGATIVE mg/dL
Specific Gravity, Urine: 1.028 (ref 1.005–1.030)
pH: 5 (ref 5.0–8.0)

## 2022-06-28 NOTE — ED Triage Notes (Signed)
Patient reports itchy skin rashes at tip of penis for several days , denies penile discharge .

## 2022-06-29 NOTE — ED Notes (Signed)
Called patient 3 times with no response.

## 2022-06-30 ENCOUNTER — Emergency Department (HOSPITAL_COMMUNITY)
Admission: EM | Admit: 2022-06-30 | Discharge: 2022-06-30 | Disposition: A | Discharge status: Home / Self Care | Payer: Medicaid Other | Attending: Emergency Medicine | Admitting: Emergency Medicine

## 2022-06-30 DIAGNOSIS — N4889 Other specified disorders of penis: Secondary | ICD-10-CM | POA: Diagnosis present

## 2022-06-30 MED ORDER — BACITRACIN ZINC 500 UNIT/GM EX OINT: 1.0000 | TOPICAL_OINTMENT | Freq: Two times a day (BID) | CUTANEOUS | 0 refills | Status: AC

## 2022-06-30 NOTE — Discharge Instructions (Signed)
Can apply bacitracin or vaseline to area to prevent irritation. Follow up with health dept if symptoms persist.

## 2022-06-30 NOTE — ED Triage Notes (Signed)
Patient here for evaluation of a itching rash he first noticed on his penis two days ago. Denies penile discharge.

## 2022-06-30 NOTE — ED Provider Notes (Signed)
[facility] EMERGENCY DEPARTMENT Provider Note   CSN: [id number] Arrival date & time: 06/30/22  1749     History  Chief Complaint  Patient presents with   Rash    Jason Stevens is a 26 y.o. male.  26 year old male presents with irritation to the end of his penis which has been present for the 2 days.  Denies new sexual partners, dysuria, testicular pain or lesions, urethral discharge or any other complaints or concerns.       Home Medications Prior to Admission medications   Medication Sig Start Date End Date Taking? Authorizing Provider  bacitracin ointment Apply 1 Application topically 2 (two) times daily. 06/30/22  Yes Jeannie Fend, PA-C  albuterol (VENTOLIN HFA) 108 (90 Base) MCG/ACT inhaler Inhale 1-2 puffs into the lungs every 6 (six) hours as needed for wheezing or shortness of breath. 08/04/20   Wynetta Fines, MD  DM-Doxylamine-Acetaminophen (NYQUIL COLD & FLU PO) Take 30 mLs by mouth at bedtime as needed (sleep).    [provider]  doxycycline (VIBRA-TABS) 100 MG tablet Take 1 tablet (100 mg total) by mouth 2 (two) times daily. 04/14/22   Roxy Horseman, PA-C  ibuprofen (ADVIL) 600 MG tablet Take 1 tablet (600 mg total) by mouth every 6 (six) hours as needed for up to 30 doses for mild pain or moderate pain. Take with food 07/01/20   Terald Sleeper, MD  naproxen (NAPROSYN) 500 MG tablet Take 1 tablet by mouth 2 times daily 05/27/22     omeprazole (PRILOSEC) 20 MG capsule Take 1 capsule (20 mg total) by mouth daily for 14 days. 07/14/17 07/28/17  Shaune Pollack, MD  omeprazole (PRILOSEC) 20 MG capsule Take 1 capsule (20 mg total) by mouth daily. 05/22/22     ondansetron (ZOFRAN ODT) 4 MG disintegrating tablet Take 1 tablet (4 mg total) by mouth every 8 (eight) hours as needed for nausea or vomiting. 07/14/17   Shaune Pollack, MD  ondansetron (ZOFRAN) 4 MG tablet Take 1 tablet (4 mg total) by mouth every 6 (six) hours. 12/02/17    Hedges, Tinnie Gens, PA-C  ondansetron (ZOFRAN-ODT) 4 MG disintegrating tablet Take 1 tablet (4 mg total) by mouth every 6 (six) hours 05/22/22     Pseudoephedrine-APAP-DM (DAYQUIL MULTI-SYMPTOM COLD/FLU PO) Take 30 mLs by mouth daily as needed (pain).    [provider]  triamcinolone ointment (KENALOG) 0.5 % Apply 1 application topically 2 (two) times daily. 02/07/21   Elson Areas, PA-C      Allergies    Patient has no known allergies.    Review of Systems   Review of Systems Negative except as per HPI Physical Exam Updated Vital Signs BP (!) 143/98 (BP Location: Right Arm)   Pulse 78   Temp 98.1 F (36.7 C) (Oral)   Resp 16   SpO2 100%  Physical Exam Vitals and nursing note reviewed. Exam conducted with a chaperone present.  Constitutional:      General: He is not in acute distress.    Appearance: He is well-developed. He is not diaphoretic.  HENT:     Head: Normocephalic and atraumatic.  Pulmonary:     Effort: Pulmonary effort is normal.  Genitourinary:    Penis: Normal and uncircumcised.   Neurological:     Mental Status: He is alert and oriented to person, place, and time.  Psychiatric:        Behavior: Behavior normal.     ED Results / Procedures / Treatments  Labs (all labs ordered are listed, but only abnormal results are displayed) Labs Reviewed - No data to display  EKG None  Radiology No results found.  Procedures Procedures    Medications Ordered in ED Medications - No data to display  ED Course/ Medical Decision Making/ A&P                           Medical Decision Making Risk OTC drugs.   26 year old male presents with concern for irritation to the penis.  No abnormality identified today.  Chaperone present for exam.  Review of labs on file, GC chlamydia - 04/13/2022.  Advised to apply barrier cream/Vaseline.  Follow-up with health department if discomfort persists.        Final Clinical Impression(s) / ED  Diagnoses Final diagnoses:  Penile irritation    Rx / DC Orders ED Discharge Orders          Ordered    bacitracin ointment  2 times daily        06/30/22 1818              Jeannie Fend, PA-C 06/30/22 1820    Gloris Manchester, MD 06/30/22 2142

## 2022-07-02 ENCOUNTER — Other Ambulatory Visit: Payer: Self-pay

## 2022-07-02 ENCOUNTER — Emergency Department (HOSPITAL_COMMUNITY)
Admission: EM | Admit: 2022-07-02 | Discharge: 2022-07-02 | Disposition: A | Discharge status: Home / Self Care | Payer: Medicaid Other | Attending: Emergency Medicine | Admitting: Emergency Medicine

## 2022-07-02 ENCOUNTER — Encounter (HOSPITAL_COMMUNITY): Payer: Self-pay

## 2022-07-02 DIAGNOSIS — Z202 Contact with and (suspected) exposure to infections with a predominantly sexual mode of transmission: Secondary | ICD-10-CM | POA: Diagnosis not present

## 2022-07-02 LAB — URINALYSIS, ROUTINE W REFLEX MICROSCOPIC
Bilirubin Urine: NEGATIVE
Glucose, UA: NEGATIVE mg/dL
Hgb urine dipstick: NEGATIVE
Ketones, ur: NEGATIVE mg/dL
Leukocytes,Ua: NEGATIVE
Nitrite: NEGATIVE
Protein, ur: NEGATIVE mg/dL
Specific Gravity, Urine: 1.028 (ref 1.005–1.030)
pH: 5 (ref 5.0–8.0)

## 2022-07-02 MED ORDER — DOXYCYCLINE HYCLATE 100 MG PO TABS
100.0000 mg | ORAL_TABLET | Freq: Two times a day (BID) | ORAL | 0 refills | Status: DC
  Filled 2022-07-02: qty 13, 7d supply, fill #0

## 2022-07-02 MED ORDER — DOXYCYCLINE HYCLATE 100 MG PO TABS
100.0000 mg | ORAL_TABLET | Freq: Once | ORAL | Status: AC
  Administered 2022-07-02: 100 mg via ORAL
  Filled 2022-07-02: qty 1

## 2022-07-02 MED ORDER — DOXYCYCLINE HYCLATE 100 MG PO CAPS: 100.0000 mg | ORAL_CAPSULE | Freq: Two times a day (BID) | ORAL | 0 refills | Status: DC

## 2022-07-02 NOTE — ED Triage Notes (Addendum)
Pt states that he is here to get treated for his Chlamydia. Pt is having clear drainage from his penis. Pt's partner tested positive.

## 2022-07-02 NOTE — Discharge Instructions (Signed)
You came to the ER after being exposed to Chlamydia. The antibiotics are at your pharmacy. They may upset your stomach so take them with food.   Please use the health department for any further STD concerns.

## 2022-07-02 NOTE — ED Provider Notes (Signed)
[facility]-EMERGENCY DEPT Provider Note   CSN: [id number] Arrival date & time: 07/02/22  2248     History  Chief Complaint  Patient presents with   Exposure to STD    Jason Stevens is a 26 y.o. male presenting today saying that he has chlamydia.  Has been sexually active with someone else with chlamydia.  Dysuria and penile discharge.   Exposure to STD       Home Medications Prior to Admission medications   Medication Sig Start Date End Date Taking? Authorizing Provider  doxycycline (VIBRAMYCIN) 100 MG capsule Take 1 capsule (100 mg total) by mouth 2 (two) times daily. 07/02/22  Yes Alexy Heldt A, PA-C  albuterol (VENTOLIN HFA) 108 (90 Base) MCG/ACT inhaler Inhale 1-2 puffs into the lungs every 6 (six) hours as needed for wheezing or shortness of breath. 08/04/20   Wynetta Fines, MD  bacitracin ointment Apply 1 Application topically 2 (two) times daily. 06/30/22   Jeannie Fend, PA-C  DM-Doxylamine-Acetaminophen (NYQUIL COLD & FLU PO) Take 30 mLs by mouth at bedtime as needed (sleep).    [provider]  doxycycline (VIBRA-TABS) 100 MG tablet Take 1 tablet (100 mg total) by mouth 2 (two) times daily. 04/14/22   Roxy Horseman, PA-C  ibuprofen (ADVIL) 600 MG tablet Take 1 tablet (600 mg total) by mouth every 6 (six) hours as needed for up to 30 doses for mild pain or moderate pain. Take with food 07/01/20   Terald Sleeper, MD  naproxen (NAPROSYN) 500 MG tablet Take 1 tablet by mouth 2 times daily 05/27/22     omeprazole (PRILOSEC) 20 MG capsule Take 1 capsule (20 mg total) by mouth daily for 14 days. 07/14/17 07/28/17  Shaune Pollack, MD  omeprazole (PRILOSEC) 20 MG capsule Take 1 capsule (20 mg total) by mouth daily. 05/22/22     ondansetron (ZOFRAN ODT) 4 MG disintegrating tablet Take 1 tablet (4 mg total) by mouth every 8 (eight) hours as needed for nausea or vomiting. 07/14/17   Shaune Pollack, MD  ondansetron (ZOFRAN) 4 MG  tablet Take 1 tablet (4 mg total) by mouth every 6 (six) hours. 12/02/17   Hedges, Tinnie Gens, PA-C  ondansetron (ZOFRAN-ODT) 4 MG disintegrating tablet Take 1 tablet (4 mg total) by mouth every 6 (six) hours 05/22/22     Pseudoephedrine-APAP-DM (DAYQUIL MULTI-SYMPTOM COLD/FLU PO) Take 30 mLs by mouth daily as needed (pain).    [provider]  triamcinolone ointment (KENALOG) 0.5 % Apply 1 application topically 2 (two) times daily. 02/07/21   Elson Areas, PA-C      Allergies    Patient has no known allergies.    Review of Systems   Review of Systems  Physical Exam Updated Vital Signs BP (!) 137/94 (BP Location: Left Arm)   Pulse 83   Temp 98.3 F (36.8 C) (Oral)   Resp 16   Ht 5\' 8"  (1.727 m)   Wt 95.3 kg   SpO2 99%   BMI 31.93 kg/m  Physical Exam Vitals and nursing note reviewed.  Constitutional:      Appearance: Normal appearance.  HENT:     Head: Normocephalic and atraumatic.  Eyes:     General: No scleral icterus.    Conjunctiva/sclera: Conjunctivae normal.  Pulmonary:     Effort: Pulmonary effort is normal. No respiratory distress.  Skin:    Findings: No rash.  Neurological:     Mental Status: He is alert.  Psychiatric:  Mood and Affect: Mood normal.     ED Results / Procedures / Treatments   Labs (all labs ordered are listed, but only abnormal results are displayed) Labs Reviewed  URINALYSIS, ROUTINE W REFLEX MICROSCOPIC  HIV ANTIBODY (ROUTINE TESTING W REFLEX)  GC/CHLAMYDIA PROBE AMP ([place]) NOT AT [facility]    EKG None  Radiology No results found.  Procedures Procedures   Medications Ordered in ED Medications  doxycycline (VIBRA-TABS) tablet 100 mg (has no administration in time range)    ED Course/ Medical Decision Making/ A&P                           Medical Decision Making Amount and/or Complexity of Data Reviewed Labs: ordered.  Risk Prescription drug management.   26 year old male presenting after chlamydia  exposure.  Given first dose of doxycycline and the remainder of the course sent to his pharmacy.   Final Clinical Impression(s) / ED Diagnoses Final diagnoses:  STD exposure    Rx / DC Orders ED Discharge Orders          Ordered    doxycycline (VIBRAMYCIN) 100 MG capsule  2 times daily        07/02/22 2333           Results and diagnoses were explained to the patient. Return precautions discussed in full. Patient had no additional questions and expressed complete understanding.   This chart was dictated using voice recognition software.  Despite best efforts to proofread,  errors can occur which can change the documentation meaning.    Saddie Benders, PA-C 07/02/22 2344    Rolan Bucco, MD 07/02/22 [id number]

## 2022-07-03 ENCOUNTER — Other Ambulatory Visit: Payer: Self-pay

## 2022-07-03 LAB — GC/CHLAMYDIA PROBE AMP (~~LOC~~) NOT AT ARMC
Chlamydia: NEGATIVE
Comment: NEGATIVE
Comment: NORMAL
Neisseria Gonorrhea: NEGATIVE

## 2022-07-03 LAB — HIV ANTIBODY (ROUTINE TESTING W REFLEX): HIV Screen 4th Generation wRfx: NONREACTIVE

## 2022-11-06 ENCOUNTER — Encounter (HOSPITAL_COMMUNITY): Payer: Self-pay

## 2022-11-06 ENCOUNTER — Other Ambulatory Visit: Payer: Self-pay

## 2022-11-06 ENCOUNTER — Emergency Department (HOSPITAL_COMMUNITY)
Admission: EM | Admit: 2022-11-06 | Discharge: 2022-11-06 | Disposition: A | Discharge status: Home / Self Care | Payer: Medicaid Other | Attending: Emergency Medicine | Admitting: Emergency Medicine

## 2022-11-06 DIAGNOSIS — B349 Viral infection, unspecified: Secondary | ICD-10-CM | POA: Diagnosis present

## 2022-11-06 NOTE — ED Triage Notes (Signed)
C/o covid x 1 week.  Last symptom was Saturday and work will not let him return w/o a note.  Denies any symptoms at this time.

## 2022-11-06 NOTE — ED Provider Notes (Signed)
[place] EMERGENCY DEPARTMENT AT [facility] Provider Note   CSN: [id number] Arrival date & time: 11/06/22  4782     History  Chief Complaint  Patient presents with   Letter for School/Work    Jason Stevens is a 27 y.o. male.  Pt reports he missed work last week because he had covid.  Pt reports his employer requires that he have a note to return.  Pt reports he feels much better.   The history is provided by the patient. No language interpreter was used.       Home Medications Prior to Admission medications   Medication Sig Start Date End Date Taking? Authorizing Provider  albuterol (VENTOLIN HFA) 108 (90 Base) MCG/ACT inhaler Inhale 1-2 puffs into the lungs every 6 (six) hours as needed for wheezing or shortness of breath. 08/04/20   Wynetta Fines, MD  bacitracin ointment Apply 1 Application topically 2 (two) times daily. 06/30/22   Jeannie Fend, PA-C  DM-Doxylamine-Acetaminophen (NYQUIL COLD & FLU PO) Take 30 mLs by mouth at bedtime as needed (sleep).    [provider]  doxycycline (VIBRA-TABS) 100 MG tablet Take 1 tablet (100 mg total) by mouth 2 (two) times daily. 04/14/22   Roxy Horseman, PA-C  doxycycline (VIBRA-TABS) 100 MG tablet Take 1 tablet (100 mg total) by mouth 2 (two) times daily. 07/02/22   Redwine, Madison A, PA-C  ibuprofen (ADVIL) 600 MG tablet Take 1 tablet (600 mg total) by mouth every 6 (six) hours as needed for up to 30 doses for mild pain or moderate pain. Take with food 07/01/20   Terald Sleeper, MD  naproxen (NAPROSYN) 500 MG tablet Take 1 tablet by mouth 2 times daily 05/27/22     omeprazole (PRILOSEC) 20 MG capsule Take 1 capsule (20 mg total) by mouth daily for 14 days. 07/14/17 07/28/17  Shaune Pollack, MD  omeprazole (PRILOSEC) 20 MG capsule Take 1 capsule (20 mg total) by mouth daily. 05/22/22     ondansetron (ZOFRAN ODT) 4 MG disintegrating tablet Take 1 tablet (4 mg total) by mouth every 8 (eight) hours as  needed for nausea or vomiting. 07/14/17   Shaune Pollack, MD  ondansetron (ZOFRAN) 4 MG tablet Take 1 tablet (4 mg total) by mouth every 6 (six) hours. 12/02/17   Hedges, Tinnie Gens, PA-C  ondansetron (ZOFRAN-ODT) 4 MG disintegrating tablet Take 1 tablet (4 mg total) by mouth every 6 (six) hours 05/22/22     Pseudoephedrine-APAP-DM (DAYQUIL MULTI-SYMPTOM COLD/FLU PO) Take 30 mLs by mouth daily as needed (pain).    [provider]  triamcinolone ointment (KENALOG) 0.5 % Apply 1 application topically 2 (two) times daily. 02/07/21   Elson Areas, PA-C      Allergies    Patient has no known allergies.    Review of Systems   Review of Systems  All other systems reviewed and are negative.   Physical Exam Updated Vital Signs BP (!) 138/95   Pulse 75   Temp 97.8 F (36.6 C)   Resp 20   Wt 95 kg   SpO2 100%   BMI 31.84 kg/m  Physical Exam Vitals and nursing note reviewed.  Constitutional:      Appearance: He is well-developed.  HENT:     Head: Normocephalic.     Mouth/Throat:     Mouth: Mucous membranes are moist.  Cardiovascular:     Rate and Rhythm: Normal rate.  Pulmonary:     Effort: Pulmonary effort is normal.  Abdominal:     General: There is no distension.  Musculoskeletal:        General: Normal range of motion.     Cervical back: Normal range of motion.  Skin:    General: Skin is warm.  Neurological:     General: No focal deficit present.     Mental Status: He is alert and oriented to person, place, and time.  Psychiatric:        Mood and Affect: Mood normal.     ED Results / Procedures / Treatments   Labs (all labs ordered are listed, but only abnormal results are displayed) Labs Reviewed - No data to display  EKG None  Radiology No results found.  Procedures Procedures    Medications Ordered in ED Medications - No data to display  ED Course/ Medical Decision Making/ A&P                             Medical Decision Making Pt had  covid last week  Risk OTC drugs.           Final Clinical Impression(s) / ED Diagnoses Final diagnoses:  Viral illness    Rx / DC Orders ED Discharge Orders     None      An After Visit Summary was printed and given to the patient.    Elson Areas, [place] 11/06/22 0835    Melene Plan, DO 11/06/22 1046

## 2023-04-10 ENCOUNTER — Encounter (HOSPITAL_COMMUNITY): Payer: Self-pay

## 2023-04-10 ENCOUNTER — Emergency Department (HOSPITAL_COMMUNITY)
Admission: EM | Admit: 2023-04-10 | Discharge: 2023-04-10 | Disposition: A | Discharge status: Home / Self Care | Payer: Medicaid Other | Attending: Emergency Medicine | Admitting: Emergency Medicine

## 2023-04-10 DIAGNOSIS — Z711 Person with feared health complaint in whom no diagnosis is made: Secondary | ICD-10-CM

## 2023-04-10 DIAGNOSIS — Z202 Contact with and (suspected) exposure to infections with a predominantly sexual mode of transmission: Secondary | ICD-10-CM | POA: Insufficient documentation

## 2023-04-10 LAB — URINALYSIS, ROUTINE W REFLEX MICROSCOPIC
Bilirubin Urine: NEGATIVE
Glucose, UA: NEGATIVE mg/dL
Hgb urine dipstick: NEGATIVE
Ketones, ur: NEGATIVE mg/dL
Leukocytes,Ua: NEGATIVE
Nitrite: NEGATIVE
Protein, ur: NEGATIVE mg/dL
Specific Gravity, Urine: 1.02 (ref 1.005–1.030)
pH: 8 (ref 5.0–8.0)

## 2023-04-10 MED ORDER — CEFTRIAXONE SODIUM 250 MG IJ SOLR
250.0000 mg | Freq: Once | INTRAMUSCULAR | Status: AC
  Administered 2023-04-10: 250 mg via INTRAMUSCULAR
  Filled 2023-04-10: qty 250

## 2023-04-10 MED ORDER — DOXYCYCLINE HYCLATE 100 MG PO CAPS: 100.0000 mg | ORAL_CAPSULE | Freq: Two times a day (BID) | ORAL | 0 refills | Status: DC

## 2023-04-10 MED ORDER — DOXYCYCLINE HYCLATE 100 MG PO TABS
100.0000 mg | ORAL_TABLET | Freq: Two times a day (BID) | ORAL | 0 refills | Status: AC
  Filled 2023-04-10: qty 14, 7d supply, fill #0

## 2023-04-10 NOTE — ED Provider Notes (Signed)
[place] EMERGENCY DEPARTMENT AT [facility] Provider Note   CSN: [id number] Arrival date & time: 04/10/23  1430     History  Chief Complaint  Patient presents with   Male GU Problem    Jason Stevens is a 27 y.o. male who presents with concern for heaviness in his testicles for the past 5 days.  Denies any fevers or chills, penile discharge, dysuria, or testicular pain. Reports a new sexual partner, unknown if they have any STIs.   Male GU Problem Presenting symptoms: no penile discharge   Associated symptoms: no fever        Home Medications Prior to Admission medications   Medication Sig Start Date End Date Taking? Authorizing Provider  doxycycline (VIBRAMYCIN) 100 MG capsule Take 1 capsule (100 mg total) by mouth 2 (two) times daily for 7 days. 04/10/23 04/17/23 Yes Arabella Merles, PA-C  albuterol (VENTOLIN HFA) 108 (90 Base) MCG/ACT inhaler Inhale 1-2 puffs into the lungs every 6 (six) hours as needed for wheezing or shortness of breath. 08/04/20   Wynetta Fines, MD  bacitracin ointment Apply 1 Application topically 2 (two) times daily. 06/30/22   Jeannie Fend, PA-C  DM-Doxylamine-Acetaminophen (NYQUIL COLD & FLU PO) Take 30 mLs by mouth at bedtime as needed (sleep).    [provider]  ibuprofen (ADVIL) 600 MG tablet Take 1 tablet (600 mg total) by mouth every 6 (six) hours as needed for up to 30 doses for mild pain or moderate pain. Take with food 07/01/20   Terald Sleeper, MD  naproxen (NAPROSYN) 500 MG tablet Take 1 tablet by mouth 2 times daily 05/27/22     omeprazole (PRILOSEC) 20 MG capsule Take 1 capsule (20 mg total) by mouth daily for 14 days. 07/14/17 07/28/17  Shaune Pollack, MD  omeprazole (PRILOSEC) 20 MG capsule Take 1 capsule (20 mg total) by mouth daily. 05/22/22     ondansetron (ZOFRAN ODT) 4 MG disintegrating tablet Take 1 tablet (4 mg total) by mouth every 8 (eight) hours as needed for nausea or vomiting. 07/14/17    Shaune Pollack, MD  ondansetron (ZOFRAN) 4 MG tablet Take 1 tablet (4 mg total) by mouth every 6 (six) hours. 12/02/17   Hedges, Tinnie Gens, PA-C  ondansetron (ZOFRAN-ODT) 4 MG disintegrating tablet Take 1 tablet (4 mg total) by mouth every 6 (six) hours 05/22/22     Pseudoephedrine-APAP-DM (DAYQUIL MULTI-SYMPTOM COLD/FLU PO) Take 30 mLs by mouth daily as needed (pain).    [provider]  triamcinolone ointment (KENALOG) 0.5 % Apply 1 application topically 2 (two) times daily. 02/07/21   Elson Areas, PA-C      Allergies    Patient has no known allergies.    Review of Systems   Review of Systems  Constitutional:  Negative for fever.  Genitourinary:  Negative for penile discharge.    Physical Exam Updated Vital Signs BP (!) 139/94 (BP Location: Left Arm)   Pulse 70   Temp 98.2 F (36.8 C) (Oral)   Resp 16   SpO2 99%  Physical Exam Vitals and nursing note reviewed. Exam conducted with a chaperone present.  Constitutional:      Appearance: Normal appearance.  HENT:     Head: Atraumatic.  Pulmonary:     Effort: Pulmonary effort is normal.  Genitourinary:    Comments: Chaperone present for GU exam No edema or erythema overlying the testicles, both testicles descended into the scrotum. Appearance normal bilaterally. No high riding testicle. No TTP  of the testicles.  Scrotum without any varicocele No penile discharge No lesions present in the genitalia    Neurological:     General: No focal deficit present.     Mental Status: He is alert.  Psychiatric:        Mood and Affect: Mood normal.        Behavior: Behavior normal.     ED Results / Procedures / Treatments   Labs (all labs ordered are listed, but only abnormal results are displayed) Labs Reviewed  URINALYSIS, ROUTINE W REFLEX MICROSCOPIC - Abnormal; Notable for the following components:      Result Value   APPearance CLOUDY (*)    All other components within normal limits  GC/CHLAMYDIA PROBE AMP ([facility]) NOT AT [facility]    EKG None  Radiology No results found.  Procedures Procedures    Medications Ordered in ED Medications  cefTRIAXone (ROCEPHIN) injection 250 mg (250 mg Intramuscular Given 04/10/23 1932)    ED Course/ Medical Decision Making/ A&P                                 Medical Decision Making Amount and/or Complexity of Data Reviewed Labs: ordered.  Risk Prescription drug management.   27 y.o. male presents to the ED for concern of testicular heaviness for 5 days  Differential diagnosis includes but is not limited to epididymitis, UTI, gonorrhea, chlamydia, varicocele  ED Course:  Patient states his testicle/felt more heavy for the past 5 days.  Requests STD treatment, but no confirmed STD exposure.  Reports he has a new partner and this started after having sexual intercourse with his partner.  Upon examination, no concern for testicular torsion, testicles are distended with no high riding testicle, no testicular pain.  No erythema or edema of the scrotum, less concern for epididymitis.  No varicocele noted.  Patient denies any dysuria, increased frequency, and urinalysis without any signs of UTI.  Patient reports concern for STDs, will go ahead and treat prophylactically with ceftriaxone and doxycycline.  He understands his gonorrhea and Chlamydia tests are pending and he will be contacted if they are positive.  Understands he and his partner will need to be treated if this is positive and refrain from sexual intercourse until they have completed antibiotics Patient given IM ceftriaxone prior to discharge.  Discharged with 7-day course of doxycycline   Impression: Concern for STD Feeling of testicular heaviness  Disposition:  The patient was discharged home with instructions to finish 7-day course of doxycycline Return precautions given.  Lab Tests: I Ordered, and personally interpreted labs.  The pertinent results include:   Urinalysis with no  nitrates or leukocytes Gonorrhea chlamydia pending           Final Clinical Impression(s) / ED Diagnoses Final diagnoses:  Concern about STD in male without diagnosis    Rx / DC Orders ED Discharge Orders          Ordered    doxycycline (VIBRAMYCIN) 100 MG capsule  2 times daily,   Status:  Discontinued        04/10/23 1934    doxycycline (VIBRAMYCIN) 100 MG capsule  2 times daily        04/10/23 1947              Arabella Merles, PA-C 04/10/23 2210    Laurence Spates, MD 04/13/23 1055

## 2023-04-10 NOTE — ED Triage Notes (Signed)
Pt arrived via POV, c/o testicular discomfort, and swelling to both testicles. Denies any itching or discharge. Concerned for STD due to recent sexual relations with new partner.

## 2023-04-10 NOTE — Discharge Instructions (Addendum)
You have been given a shot of antibiotic here to treat for possible gonorrhea and/or chlamydia  You have been prescribed doxycycline. Take this antibiotic 2 times a day for the next 7 days. Take the full course of your antibiotic even if you start feeling better. Antibiotics may cause you to have diarrhea.   You will be contacted if your gonorrhea and/or Chlamydia test is positive.  If it is positive, please contact your partner and have them treated. Abstain from sexual intercourse until both sexual partners have completed the full course of antibiotics   Return to the ER for any unexplained fever or chills, severe testicular pain, any new or concerning symptoms.

## 2023-04-11 ENCOUNTER — Other Ambulatory Visit: Payer: Self-pay

## 2023-04-11 LAB — GC/CHLAMYDIA PROBE AMP (~~LOC~~) NOT AT ARMC
Chlamydia: NEGATIVE
Comment: NEGATIVE
Comment: NORMAL
Neisseria Gonorrhea: NEGATIVE
